# Patient Record
Sex: Male | Born: 1965
Health system: Southern US, Community
[De-identification: ages and names within clinical notes are randomized; demographics above are authoritative.]

## PROBLEM LIST (undated history)

## (undated) DIAGNOSIS — K200 Eosinophilic esophagitis: Secondary | ICD-10-CM

## (undated) DIAGNOSIS — Z86010 Personal history of colonic polyps: Secondary | ICD-10-CM

## (undated) DIAGNOSIS — T7840XA Allergy, unspecified, initial encounter: Secondary | ICD-10-CM

## (undated) DIAGNOSIS — K219 Gastro-esophageal reflux disease without esophagitis: Secondary | ICD-10-CM

## (undated) DIAGNOSIS — K648 Other hemorrhoids: Secondary | ICD-10-CM

## (undated) DIAGNOSIS — K222 Esophageal obstruction: Secondary | ICD-10-CM

## (undated) HISTORY — DX: Allergy, unspecified, initial encounter: T78.40XA

## (undated) HISTORY — PX: HEMORRHOID BANDING: SHX5850

## (undated) HISTORY — PX: COLONOSCOPY W/ BIOPSIES: SHX1374

## (undated) HISTORY — DX: Eosinophilic esophagitis: K20.0

## (undated) HISTORY — PX: COLONOSCOPY: SHX174

## (undated) HISTORY — DX: Personal history of colonic polyps: Z86.010

## (undated) HISTORY — DX: Esophageal obstruction: K22.2

## (undated) HISTORY — PX: UPPER GASTROINTESTINAL ENDOSCOPY: SHX188

## (undated) HISTORY — PX: VASECTOMY: SHX75

## (undated) HISTORY — DX: Gastro-esophageal reflux disease without esophagitis: K21.9

## (undated) HISTORY — PX: POLYPECTOMY: SHX149

## (undated) HISTORY — PX: WISDOM TOOTH EXTRACTION: SHX21

## (undated) HISTORY — DX: Other hemorrhoids: K64.8

---

## 1999-12-24 ENCOUNTER — Emergency Department (HOSPITAL_COMMUNITY): Admission: EM | Admit: 1999-12-24 | Discharge: 1999-12-24 | Payer: Self-pay | Admitting: Emergency Medicine

## 2003-07-31 DIAGNOSIS — K222 Esophageal obstruction: Secondary | ICD-10-CM

## 2003-07-31 HISTORY — DX: Esophageal obstruction: K22.2

## 2004-01-12 ENCOUNTER — Ambulatory Visit (HOSPITAL_COMMUNITY): Admission: RE | Admit: 2004-01-12 | Discharge: 2004-01-12 | Payer: Self-pay | Admitting: Gastroenterology

## 2004-01-12 HISTORY — PX: ESOPHAGOGASTRODUODENOSCOPY: SHX1529

## 2008-01-14 ENCOUNTER — Emergency Department (HOSPITAL_COMMUNITY): Admission: EM | Admit: 2008-01-14 | Discharge: 2008-01-14 | Payer: Self-pay | Admitting: Emergency Medicine

## 2010-01-15 IMAGING — CR DG FOOT 2V*R*
2 series · 2 of 2 positions shown · non-contrast
Comparison: None available.

CLINICAL DATA: Status post ankle dislocation.

RIGHT FOOT - 2 VIEW

[t foot ap right * (1 of 2)]
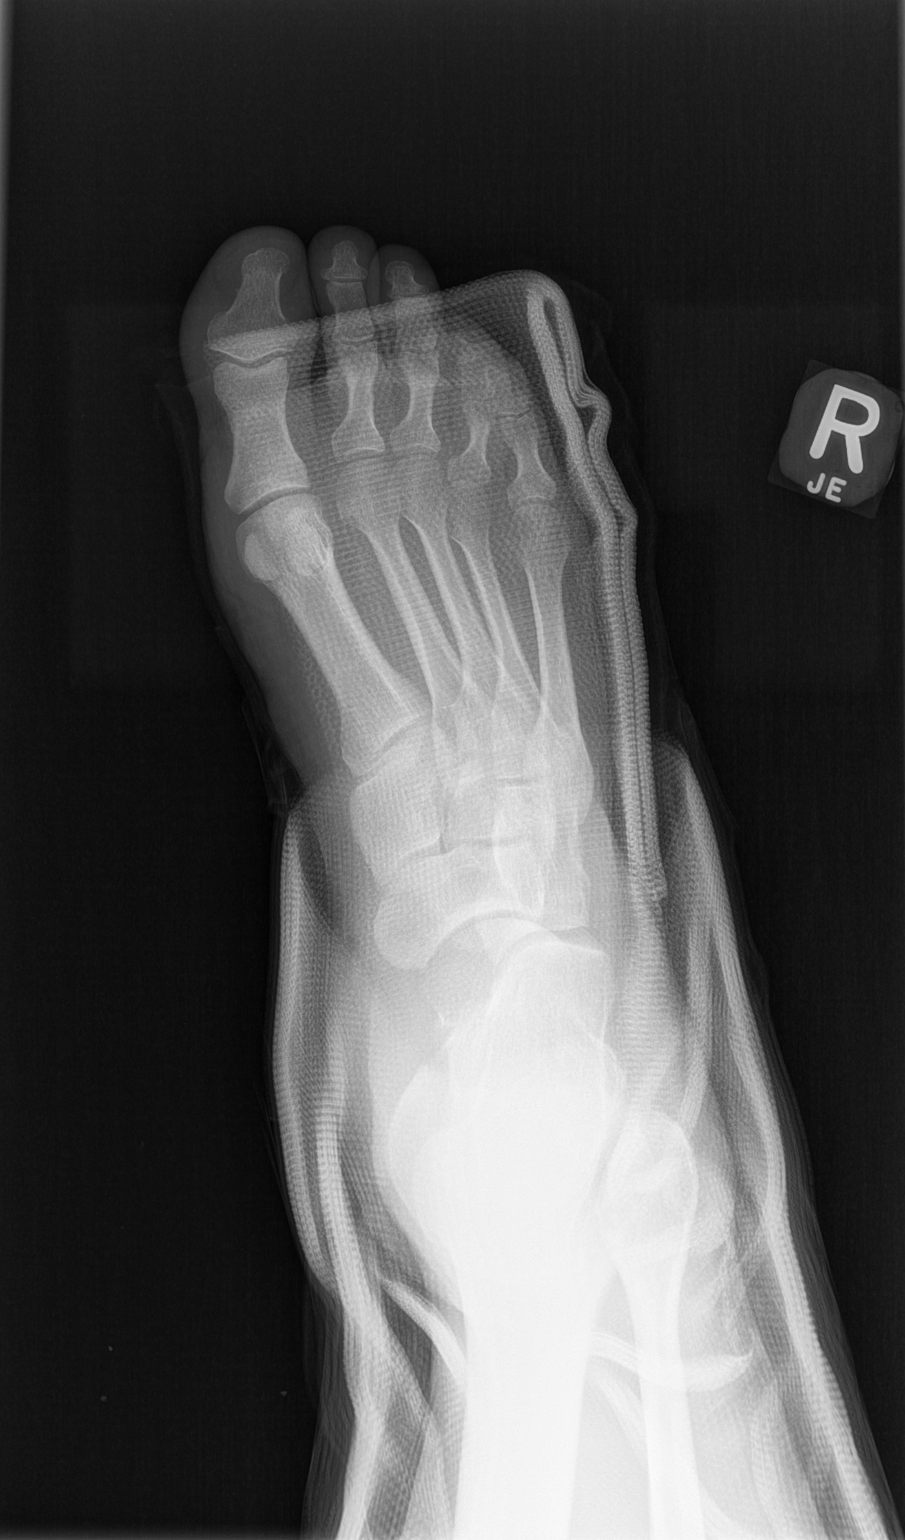

[t foot ap right * (2 of 2)]
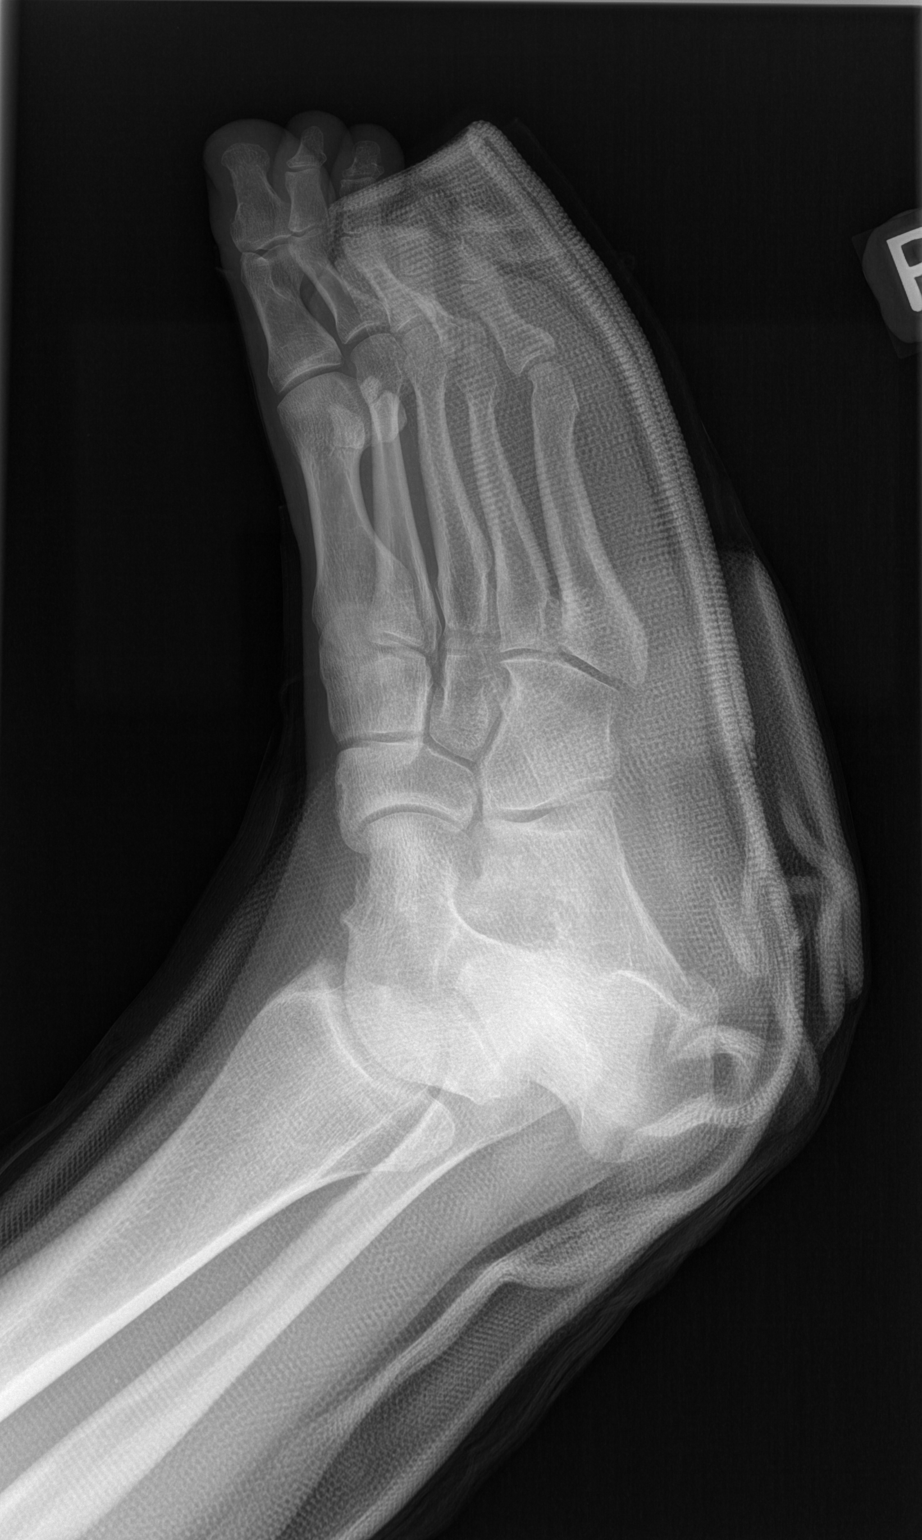

[2 of 2 positions shown; findings below may reference images not displayed]

FINDINGS: Two views the right foot are provided.  The patient is in
a fiberglass cast.  No acute bony or joint abnormality is
identified.
IMPRESSION: No acute finding.

## 2010-12-15 NOTE — Op Note (Signed)
Stephen Chambers, Stephen Chambers                           ACCOUNT NO.:  [id number]   MEDICAL RECORD NO.:  [id number]                   PATIENT TYPE:  AMB   LOCATION:  ENDO                                 FACILITY:  [facility]   PHYSICIAN:  John C. Madilyn Fireman, M.D.                 DATE OF BIRTH:  1965/10/12   DATE OF PROCEDURE:  01/12/2004  DATE OF DISCHARGE:                                 OPERATIVE REPORT   PROCEDURE:  Esophagogastroduodenoscopy with esophageal dilatation.   INDICATIONS FOR PROCEDURE:  Dysphagia is suggestive of lower esophageal ring  or stricture.   DESCRIPTION OF PROCEDURE:  The patient was placed in the left lateral  decubitus position and placed on the pulse monitor, with continuous low-flow  oxygen delivered by nasal cannula.  He was sedated with 62.5 mcg IV fentanyl  and 5 mg IV Versed.  The Olympus video endoscope was advanced under direct  vision into the oropharynx and esophagus.  The esophagus was straight and of  normal caliber, with the squamocolumnar line at 38 cm.  There did appear to  be some mild stricturing or indistinct ring at 38 cm.  There was no visible  hiatal hernia.   The stomach was entered and a small amount of liquid secretions were  suctioned from the fundus.  Retroflexed view of the cardia was unremarkable.  The fundus, body, antrum and pylorus all appeared normal.  The duodenum was  entered and both the bulb and second portion were well inspected and  appeared to be within normal limits.  The scope was advanced as far as  possible down the distal duodenum and a Savary guide wire placed, with the  scope withdrawn.  A single 17 mm Savary dilator was passed over the guide  wire with mild to moderate resistance and no blood seen on withdrawal.  The  dilator was removed together with the wire.   The patient was returned to the recovery room in stable condition.  He  tolerated the procedure well and there were no immediate complications.   IMPRESSION:  Subtle  lower esophageal ring; status post dilatation to 17 mm.   PLAN:  Advance diet and observe for response to dilatation.                                               John C. Madilyn Fireman, M.D.    JCH/MEDQ  D:  01/12/2004  T:  01/12/2004  Job:  [id number]   cc:   Havery Moros  [address]  [place]  [place] [address]  Fax: [phone]

## 2012-06-24 ENCOUNTER — Ambulatory Visit (INDEPENDENT_AMBULATORY_CARE_PROVIDER_SITE_OTHER): Payer: BC Managed Care – PPO | Admitting: Family Medicine

## 2012-06-24 DIAGNOSIS — Z23 Encounter for immunization: Secondary | ICD-10-CM

## 2012-12-05 ENCOUNTER — Encounter: Payer: Self-pay | Admitting: Internal Medicine

## 2012-12-25 ENCOUNTER — Encounter: Payer: Self-pay | Admitting: Internal Medicine

## 2012-12-30 ENCOUNTER — Ambulatory Visit (INDEPENDENT_AMBULATORY_CARE_PROVIDER_SITE_OTHER): Payer: BC Managed Care – PPO | Admitting: Internal Medicine

## 2012-12-30 ENCOUNTER — Encounter: Payer: Self-pay | Admitting: Internal Medicine

## 2012-12-30 VITALS — BP 102/70 | HR 72 | Ht 70.0 in | Wt 170.2 lb

## 2012-12-30 DIAGNOSIS — R1310 Dysphagia, unspecified: Secondary | ICD-10-CM

## 2012-12-30 DIAGNOSIS — K648 Other hemorrhoids: Secondary | ICD-10-CM

## 2012-12-30 MED ORDER — HYDROCORTISONE ACETATE 25 MG RE SUPP: 25.0000 mg | Freq: Every day | RECTAL | Status: DC

## 2012-12-30 NOTE — Progress Notes (Signed)
Subjective:    Patient ID: Stephen Chambers, male    DOB: 1965/08/04, 47 y.o.   MRN: [id number]  HPI The patient is a pleasant married 47 year old white man who presents with complaints of dysphagia. He's had intermittent problems in the past and he underwent an EGD with dilation of a subtle ring in 2005 by Dr. Madilyn Fireman. He recalls improving but has not taken her regular PPI and over the years he has developed intermittent solid food dysphagia with a suprasternal sticking point. Food such as steak, chicken, rice and bread will cause problems. There are spell where he'll have frequent episodes during a week and then he'll go for weeks without. There has been no weight loss or bleeding. He does not really have significant heartburn symptoms.  He also has complaints of itching in the anal area and what she perceives to be hemorrhoids. He does not have a daily bowel movement, he does sometimes sit more than a few minutes on the toilet but he denies significant straining and there is no rectal pain. He uses light weights in the gym. There are no other clear risk factors for hemorrhoids. He does have some mucus discharge in the anal area on the underwear at times and very rarely he has seen a slight streak of red blood on the toilet paper only. His bowel habits are regular overall in that they are unchanged and he does have several bowel movements a week.  His GI review of systems is otherwise negative.  No Known Allergies No outpatient prescriptions prior to visit.   No facility-administered medications prior to visit.   Past Medical History  Diagnosis Date  . Schatzki's ring 2005   Past Surgical History  Procedure Laterality Date  . Esophagogastroduodenoscopy  01/12/04   History   Social History  . Marital Status: Married    Spouse Name: Dionne    Number of Children: 3       Occupational History  . construction-commercial     owner   Social History Main Topics  . Smoking status: Never Smoker    . Smokeless tobacco: Never Used  . Alcohol Use: Yes  . Drug Use: No       Other Topics Concern  . None   Social History Narrative   Married, 2 sons one daughter.   He owns a Control and instrumentation engineer.   2-3 caffeinated beverages a day and 2 alcoholic beverages a day as of 12/30/2012.   Family History  Problem Relation Age of Onset  . Colon cancer Paternal Grandfather        Review of Systems This is positive for allergies and sinus trouble but all other review of systems are negative or except as per history of present illness.    Objective:   Physical Exam General:  Well-developed, well-nourished and in no acute distress Eyes:  anicteric. Neck:   supple w/o thyromegaly or mass.  Lungs: Clear to auscultation bilaterally. Heart:  S1S2, no rubs, murmurs, gallops. Abdomen:  soft, non-tender, no hepatosplenomegaly, hernia, or mass and BS+.  Rectal:  normal anoderm and no mass, normal resting tone. GU:  Normal prostate Lymph:  no cervical or supraclavicular adenopathy. Extremities:   no edema Skin   no rash. Neuro:  A&O x 3.  Psych:  appropriate mood and  Affect.   Data Reviewed:        Assessment & Plan:  Dysphagia, unspecified(787.20)  Internal hemorrhoids with other complication - Plan: hydrocortisone (ANUSOL-HC) 25 MG suppository  1.  EGD w/ dilation - The risks and benefits as well as alternatives of endoscopic procedure(s) have been discussed and reviewed. All questions answered. The patient agrees to proceed..  2. Anusol HC, Balneol, Benefiber, Hemorrhoid handout. Consider hemorrhoid ligation in future.

## 2012-12-30 NOTE — Patient Instructions (Addendum)
You have been scheduled for an endoscopy with propofol. Please follow written instructions given to you at your visit today. If you use inhalers (even only as needed), please bring them with you on the day of your procedure. Your physician has requested that you go to www.startemmi.com and enter the access code given to you at your visit today. This web site gives a general overview about your procedure. However, you should still follow specific instructions given to you by our office regarding your preparation for the procedure.   Purchase BALENOL over the counter to use on your hemorrhoids.  [facility] has this.  Also purchase a Fiber supplement, we are giving you information on the different brands.  We have sent the following medications to your pharmacy for you to pick up at your convenience: [facility]   Today we are giving you information to read on hemorrhoid non-surgical treatment.  Go to www.[email or web address] for more information.  Thank you for choosing me and [facility].  Iva Boop, M.D., [facility]

## 2012-12-31 ENCOUNTER — Encounter: Payer: Self-pay | Admitting: Internal Medicine

## 2013-01-21 ENCOUNTER — Ambulatory Visit (AMBULATORY_SURGERY_CENTER): Payer: BC Managed Care – PPO | Admitting: Internal Medicine

## 2013-01-21 ENCOUNTER — Encounter: Payer: Self-pay | Admitting: Internal Medicine

## 2013-01-21 VITALS — BP 111/67 | HR 60 | Temp 98.0°F | Resp 13 | Ht 70.0 in | Wt 170.0 lb

## 2013-01-21 DIAGNOSIS — K200 Eosinophilic esophagitis: Secondary | ICD-10-CM

## 2013-01-21 DIAGNOSIS — R1310 Dysphagia, unspecified: Secondary | ICD-10-CM

## 2013-01-21 DIAGNOSIS — R933 Abnormal findings on diagnostic imaging of other parts of digestive tract: Secondary | ICD-10-CM

## 2013-01-21 MED ORDER — PANTOPRAZOLE SODIUM 40 MG PO TBEC: 40.0000 mg | DELAYED_RELEASE_TABLET | Freq: Every day | ORAL | Status: DC

## 2013-01-21 MED ORDER — SODIUM CHLORIDE 0.9 % IV SOLN: 500.0000 mL | INTRAVENOUS | Status: DC

## 2013-01-21 NOTE — Progress Notes (Signed)
Called to room to assist during endoscopic procedure.  Patient ID and intended procedure confirmed with present staff. Received instructions for my participation in the procedure from the performing physician.  

## 2013-01-21 NOTE — Patient Instructions (Addendum)
YOU HAD AN ENDOSCOPIC PROCEDURE TODAY AT THE [facility]: Refer to the procedure report that was given to you for any specific questions about what was found during the examination.  If the procedure report does not answer your questions, please call your gastroenterologist to clarify.  If you requested that your care partner not be given the details of your procedure findings, then the procedure report has been included in a sealed envelope for you to review at your convenience later.  YOU SHOULD EXPECT: Some feelings of bloating in the abdomen. Passage of more gas than usual.  Walking can help get rid of the air that was put into your GI tract during the procedure and reduce the bloating. If you had a lower endoscopy (such as a colonoscopy or flexible sigmoidoscopy) you may notice spotting of blood in your stool or on the toilet paper. If you underwent a bowel prep for your procedure, then you may not have a normal bowel movement for a few days.  DIET: Your first meal following the procedure should be a light meal and then it is ok to progress to your normal diet.  A half-sandwich or bowl of soup is an example of a good first meal.  Heavy or fried foods are harder to digest and may make you feel nauseous or bloated.  Likewise meals heavy in dairy and vegetables can cause extra gas to form and this can also increase the bloating.  Drink plenty of fluids but you should avoid alcoholic beverages for 24 hours.  ACTIVITY: Your care partner should take you home directly after the procedure.  You should plan to take it easy, moving slowly for the rest of the day.  You can resume normal activity the day after the procedure however you should NOT DRIVE or use heavy machinery for 24 hours (because of the sedation medicines used during the test).    SYMPTOMS TO REPORT IMMEDIATELY: A gastroenterologist can be reached at any hour.  During normal business hours, 8:30 AM to 5:00 PM Monday through Friday,  call [phone].  After hours and on weekends, please call the GI answering service at [phone] who will take a message and have the physician on call contact you.   Following upper endoscopy (EGD)  Vomiting of blood or coffee ground material  New chest pain or pain under the shoulder blades  Painful or persistently difficult swallowing  New shortness of breath  Fever of 100F or higher  Black, tarry-looking stools  FOLLOW UP: If any biopsies were taken you will be contacted by phone or by letter within the next 1-3 weeks.  Call your gastroenterologist if you have not heard about the biopsies in 3 weeks.  Our staff will call the home number listed on your records the next business day following your procedure to check on you and address any questions or concerns that you may have at that time regarding the information given to you following your procedure. This is a courtesy call and so if there is no answer at the home number and we have not heard from you through the emergency physician on call, we will assume that you have returned to your regular daily activities without incident.  SIGNATURES/CONFIDENTIALITY: You and/or your care partner have signed paperwork which will be entered into your electronic medical record.  These signatures attest to the fact that that the information above on your After Visit Summary has been reviewed and is understood.  Full responsibility   of the confidentiality of this discharge information lies with you and/or your care-partner.  The esophagus had changes that look like a condition called eosinophilic esophagitis. It is a condition related to reflux and allergy though it is difficult to sort out the allergies in most cases.  I took biopsies and dilated the esophagus to 16 mm. You will avoid regular foods today.  I am going to start you on an acid-blocking medicine as that usually helps this problem. Sometimes other allergy medications are  needed.  Please schedule a follow-up visit for August.  I appreciate the opportunity to care for you. Iva Boop, MD, [facility]

## 2013-01-21 NOTE — Op Note (Addendum)
[facility] [address] [place], [address]   ENDOSCOPY PROCEDURE REPORT  PATIENT: Julyan, Gales  MR#: [id number] BIRTHDATE: 06/17/1966 , 47  yrs. old GENDER: Male ENDOSCOPIST: Iva Boop, MD, [facility] PROCEDURE DATE:  01/21/2013 PROCEDURE:  EGD w/ biopsy and Maloney dilation of esophagus ASA CLASS:     Class I INDICATIONS:  Dysphagia. MEDICATIONS: propofol (Diprivan) 300mg  IV, MAC sedation, administered by CRNA, and These medications were titrated to patient response per physician's verbal order TOPICAL ANESTHETIC: none  DESCRIPTION OF PROCEDURE: After the risks benefits and alternatives of the procedure were thoroughly explained, informed consent was obtained.  The LB [id number] endoscope was introduced through the mouth and advanced to the second portion of the duodenum. Without limitations.  The instrument was slowly withdrawn as the mucosa was fully examined.       ESOPHAGUS: Eosinophilic esophagitis was suspected with mucosal changes that included circumferential folds, longitudinal furrows and white spots were found in the entire esophagus.   Biopsies of the proximal, mid and distal esophagus taken.  The remainder of the upper endoscopy exam was otherwise normal. Retroflexed views revealed no abnormalities.     The scope was then withdrawn from the patient, a 48 Fr. Maloney dilator passed, scope re-inserted and no major tears seen. The procedure was completed.  COMPLICATIONS: There were no complications. ENDOSCOPIC IMPRESSION: 1.   Eosinophilic esophagitis was suspected in the entire esophagus, biopsied and dilated 48 Fr 2.   The remainder of the upper endoscopy exam was otherwise normal  RECOMMENDATIONS: 1.  Clear liquids until , then soft foods rest iof day.  Resume prior diet tomorrow. 2.  PPI qam - pantoprazole 40 mg every AM 3.   call for appointment with Dr.  Leone Payor - to be seen in August  eSigned:  Iva Boop, MD, [facility]  01/21/2013 2:52 PM  CC:The Patient

## 2013-01-22 ENCOUNTER — Telehealth: Payer: Self-pay | Admitting: *Deleted

## 2013-01-22 NOTE — Telephone Encounter (Signed)
No answer, message left for the patient. 

## 2013-01-27 ENCOUNTER — Encounter: Payer: Self-pay | Admitting: Internal Medicine

## 2013-01-27 DIAGNOSIS — K200 Eosinophilic esophagitis: Secondary | ICD-10-CM

## 2013-01-27 HISTORY — DX: Eosinophilic esophagitis: K20.0

## 2013-01-27 NOTE — Progress Notes (Signed)
Quick Note:  Eosinophilic esophagitis Stay on PPI See me in August ______

## 2013-06-12 ENCOUNTER — Ambulatory Visit (INDEPENDENT_AMBULATORY_CARE_PROVIDER_SITE_OTHER): Payer: BC Managed Care – PPO

## 2013-06-12 DIAGNOSIS — Z23 Encounter for immunization: Secondary | ICD-10-CM

## 2014-02-14 ENCOUNTER — Other Ambulatory Visit: Payer: Self-pay | Admitting: Internal Medicine

## 2014-04-17 ENCOUNTER — Other Ambulatory Visit: Payer: Self-pay | Admitting: Internal Medicine

## 2014-05-21 ENCOUNTER — Other Ambulatory Visit: Payer: Self-pay | Admitting: Internal Medicine

## 2014-08-09 ENCOUNTER — Ambulatory Visit (INDEPENDENT_AMBULATORY_CARE_PROVIDER_SITE_OTHER): Payer: BLUE CROSS/BLUE SHIELD | Admitting: Internal Medicine

## 2014-08-09 ENCOUNTER — Encounter: Payer: Self-pay | Admitting: Internal Medicine

## 2014-08-09 VITALS — BP 102/76 | HR 72 | Ht 69.0 in | Wt 170.2 lb

## 2014-08-09 DIAGNOSIS — K200 Eosinophilic esophagitis: Secondary | ICD-10-CM

## 2014-08-09 DIAGNOSIS — K648 Other hemorrhoids: Secondary | ICD-10-CM

## 2014-08-09 HISTORY — DX: Other hemorrhoids: K64.8

## 2014-08-09 MED ORDER — PANTOPRAZOLE SODIUM 40 MG PO TBEC: DELAYED_RELEASE_TABLET | ORAL | Status: DC

## 2014-08-09 NOTE — Assessment & Plan Note (Signed)
Grade 2 on anoscopy RP banded today to sstart Tx to see if swelling and anal irritation are relieved w/ Tx. RTC 2 weeks to band others if needed

## 2014-08-09 NOTE — Patient Instructions (Signed)
HEMORRHOID BANDING PROCEDURE    FOLLOW-UP CARE   1. The procedure you have had should have been relatively painless since the banding of the area involved does not have nerve endings and there is no pain sensation.  The rubber band cuts off the blood supply to the hemorrhoid and the band may fall off as soon as 48 hours after the banding (the band may occasionally be seen in the toilet bowl following a bowel movement). You may notice a temporary feeling of fullness in the rectum which should respond adequately to plain Tylenol or Motrin.  2. Following the banding, avoid strenuous exercise that evening and resume full activity the next day.  A sitz bath (soaking in a warm tub) or bidet is soothing, and can be useful for cleansing the area after bowel movements.     3. To avoid constipation, take two tablespoons of natural wheat bran, natural oat bran, flax, Benefiber or any over the counter fiber supplement and increase your water intake to 7-8 glasses daily.    4. Unless you have been prescribed anorectal medication, do not put anything inside your rectum for two weeks: No suppositories, enemas, fingers, etc.  5. Occasionally, you may have more bleeding than usual after the banding procedure.  This is often from the untreated hemorrhoids rather than the treated one.  Don't be concerned if there is a tablespoon or so of blood.  If there is more blood than this, lie flat with your bottom higher than your head and apply an ice pack to the area. If the bleeding does not stop within a half an hour or if you feel faint, call our office at [phone] or go to the emergency room.  6. Problems are not common; however, if there is a substantial amount of bleeding, severe pain, chills, fever or difficulty passing urine (very rare) or other problems, you should call us at [phone] or report to the nearest emergency room.  7. Do not stay seated continuously for more than 2-3 hours for a day or two  after the procedure.  Tighten your buttock muscles 10-15 times every two hours and take 10-15 deep breaths every 1-2 hours.  Do not spend more than a few minutes on the toilet if you cannot empty your bowel; instead re-visit the toilet at a later time.    We have sent the following medications to your pharmacy for you to pick up at your convenience: Protonix  We have made your next banding appointment for January 25th at 3:45pm.   I appreciate the opportunity to care for you. Silvano Rusk, M.D., [facility]

## 2014-08-09 NOTE — Progress Notes (Signed)
   Subjective:    Patient ID: Stephen Chambers, male    DOB: February 29, 1966, 49 y.o.   MRN: [id number]  HPI Here for f/u eosiniophilic esophagitis. Has ? About anal sxs and need for colonoscopy.  1x month dysphagia - "can feel food move slowly" - not impact. Needs refill on PPI.  Still has irritation post-defecation with some itching, swelling. Rare BRB with wiping. Tried fiber supplements but did not change q 3 d BM - so stopped. Easy defecation without straining reported. No pain with defecation but has anal irritation sxs after. Also tried [facility] suppositories without much benefit.  Paternal grandfather had colon cancer late 29's. Maternal grandmother had it in 70's. His wife wants him to see if he needs a colonoscopy now.  Review of Systems     Objective:   Physical Exam NAD  Rectal exam with some anals spasm and increased resting tone but NL anoderm and non-tender.    Anoscopy was performed with the patient in the left lateral decubitus position and revealed Grade 2 internal hemorrhoids in all positions.   PROCEDURE NOTE: The patient presents with symptomatic grade 2  hemorrhoids, requesting rubber band ligation of his/her hemorrhoidal disease.  All risks, benefits and alternative forms of therapy were described and informed consent was obtained.   The anorectum was pre-medicated with 0.125% NTG and 5% lidocaine The decision was made to band the RP internal hemorrhoid, and the [place] was used to perform band ligation without complication.  Digital anorectal examination was then performed to assure proper positioning of the band, and to adjust the banded tissue as required.  The patient was discharged home without pain or other issues.  Dietary and behavioral recommendations were given and along with follow-up instructions.      The patient will return in 2 weeks for  follow-up and possible additional banding as required. No complications were encountered and the patient  tolerated the procedure well.       Assessment & Plan:  Eosinophilic esophagitis Refill PPI today  Internal hemorrhoids with complication Grade 2 on anoscopy RP banded today to sstart Tx to see if swelling and anal irritation are relieved w/ Tx. RTC 2 weeks to band others if needed   Colonoscopy at age 12 - he is not at high risk.

## 2014-08-09 NOTE — Assessment & Plan Note (Addendum)
Refill PPI today

## 2014-08-23 ENCOUNTER — Ambulatory Visit (INDEPENDENT_AMBULATORY_CARE_PROVIDER_SITE_OTHER): Payer: BLUE CROSS/BLUE SHIELD | Admitting: Internal Medicine

## 2014-08-23 ENCOUNTER — Encounter: Payer: Self-pay | Admitting: Internal Medicine

## 2014-08-23 VITALS — BP 114/68 | HR 68 | Ht 69.0 in | Wt 174.0 lb

## 2014-08-23 DIAGNOSIS — K648 Other hemorrhoids: Secondary | ICD-10-CM

## 2014-08-23 NOTE — Patient Instructions (Signed)
HEMORRHOID BANDING PROCEDURE    FOLLOW-UP CARE   1. The procedure you have had should have been relatively painless since the banding of the area involved does not have nerve endings and there is no pain sensation.  The rubber band cuts off the blood supply to the hemorrhoid and the band may fall off as soon as 48 hours after the banding (the band may occasionally be seen in the toilet bowl following a bowel movement). You may notice a temporary feeling of fullness in the rectum which should respond adequately to plain Tylenol or Motrin.  2. Following the banding, avoid strenuous exercise that evening and resume full activity the next day.  A sitz bath (soaking in a warm tub) or bidet is soothing, and can be useful for cleansing the area after bowel movements.     3. To avoid constipation, take two tablespoons of natural wheat bran, natural oat bran, flax, Benefiber or any over the counter fiber supplement and increase your water intake to 7-8 glasses daily.    4. Unless you have been prescribed anorectal medication, do not put anything inside your rectum for two weeks: No suppositories, enemas, fingers, etc.  5. Occasionally, you may have more bleeding than usual after the banding procedure.  This is often from the untreated hemorrhoids rather than the treated one.  Don't be concerned if there is a tablespoon or so of blood.  If there is more blood than this, lie flat with your bottom higher than your head and apply an ice pack to the area. If the bleeding does not stop within a half an hour or if you feel faint, call our office at [phone] or go to the emergency room.  6. Problems are not common; however, if there is a substantial amount of bleeding, severe pain, chills, fever or difficulty passing urine (very rare) or other problems, you should call us at [phone] or report to the nearest emergency room.  7. Do not stay seated continuously for more than 2-3 hours for a day or two  after the procedure.  Tighten your buttock muscles 10-15 times every two hours and take 10-15 deep breaths every 1-2 hours.  Do not spend more than a few minutes on the toilet if you cannot empty your bowel; instead re-visit the toilet at a later time.    Please follow up with [place] as needed.   I appreciate the opportunity to care for you. Silvano Rusk, M.D., [facility]

## 2014-08-23 NOTE — Assessment & Plan Note (Signed)
LL and RA banded 

## 2014-08-23 NOTE — Progress Notes (Signed)
Patient ID: Stephen Chambers, male   DOB: 07-Jun-1966, 49 y.o.   MRN: [id number]     PROCEDURE NOTE: The patient presents with symptomatic grade 2  hemorrhoids, requesting rubber band ligation of his/her hemorrhoidal disease.  All risks, benefits and alternative forms of therapy were described and informed consent was obtained.   The anorectum was pre-medicated with 0.125% NTG and 5% lidocaine The decision was made to band the LL and RA  internal hemorrhoids, and the [place] was used to perform band ligation without complication.  Digital anorectal examination was then performed to assure proper positioning of the band, and to adjust the banded tissue as required.  The patient was discharged home without pain or other issues.  Dietary and behavioral recommendations were given and along with follow-up instructions.      The patient will return in prn for  follow-up and possible additional banding as required. No complications were encountered and the patient tolerated the procedure well.

## 2015-05-06 ENCOUNTER — Ambulatory Visit (INDEPENDENT_AMBULATORY_CARE_PROVIDER_SITE_OTHER): Payer: BLUE CROSS/BLUE SHIELD

## 2015-05-06 DIAGNOSIS — Z23 Encounter for immunization: Secondary | ICD-10-CM

## 2015-08-30 ENCOUNTER — Ambulatory Visit (INDEPENDENT_AMBULATORY_CARE_PROVIDER_SITE_OTHER): Payer: BLUE CROSS/BLUE SHIELD | Admitting: Internal Medicine

## 2015-08-30 ENCOUNTER — Encounter: Payer: Self-pay | Admitting: Internal Medicine

## 2015-08-30 VITALS — BP 110/70 | HR 60 | Ht 69.0 in | Wt 168.4 lb

## 2015-08-30 DIAGNOSIS — R194 Change in bowel habit: Secondary | ICD-10-CM | POA: Diagnosis not present

## 2015-08-30 DIAGNOSIS — K625 Hemorrhage of anus and rectum: Secondary | ICD-10-CM | POA: Diagnosis not present

## 2015-08-30 NOTE — Patient Instructions (Signed)
  You have been scheduled for a colonoscopy. Please follow written instructions given to you at your visit today.  Please pick up your over the counter prep supplies at the pharmacy. If you use inhalers (even only as needed), please bring them with you on the day of your procedure. Your physician has requested that you go to www.startemmi.com and enter the access code given to you at your visit today. This web site gives a general overview about your procedure. However, you should still follow specific instructions given to you by our office regarding your preparation for the procedure.   I appreciate the opportunity to care for you. Carl Gessner, MD, FACG  

## 2015-08-30 NOTE — Progress Notes (Signed)
   Subjective:    Patient ID: Stephen Chambers, male    DOB: 1965-11-27, 50 y.o.   MRN: [id number] Chief complaint: Fecal soiling hemorrhoids HPI  The patient is here for follow-up. Approximate a year ago he had banding of hemorrhoids in an effort to help fecal soiling and rare rectal bleeding. He says he is really not any better. Still having difficulty cleansing after defecation and irritation and rare rectal bleeding Not putting stool off so going more frequently - usu q 2-3 d but maybe more now No pain with defecation  Was Rx cipro by PCP recently for the urgent defecation  Medications, allergies, past medical history, past surgical history, family history and social history are reviewed and updated in the EMR.  Review of Systems No GU sxs except occasional urgency    Objective:   Physical Exam BP 110/70 mmHg  Pulse 60  Ht 5\' 9"  (1.753 m)  Wt 168 lb 6.4 oz (76.386 kg)  BMI 24.86 kg/m2 abd NT  Anoderm inspection revealed NL Anal wink was absent Digital exam revealed normal resting tone and voluntary squeeze. No mass or rectocele present. Simulated defecation with valsalva revealed appropriate abdominal contraction and descent.  Anoscopy shows what I think are grade 1 internal hemorrhoids in all 3 positions     Assessment & Plan:   1. Rectal bleeding   2. Altered bowel habits    Could be all attributable to his hemorrhoids however at this point since its persisted I think in his age range a colonoscopy is reasonable. Hopefully it will be negative but I think we need to know that. Venting consider trying to repeat band the hemorrhoids to see if that helps. Anorectal manometry could be necessary. I do think he should take a fiber supplementation on a regular basis.The risks and benefits as well as alternatives of endoscopic procedure(s) have been discussed and reviewed. All questions answered. The patient agrees to proceed. I appreciate the opportunity to care for this  patient. CC: [name], MD

## 2015-09-04 ENCOUNTER — Other Ambulatory Visit: Payer: Self-pay | Admitting: Internal Medicine

## 2015-09-05 ENCOUNTER — Encounter: Payer: BLUE CROSS/BLUE SHIELD | Admitting: Internal Medicine

## 2015-09-05 ENCOUNTER — Telehealth: Payer: Self-pay | Admitting: Internal Medicine

## 2015-09-05 NOTE — Telephone Encounter (Signed)
Patient advised of to start about [phone]

## 2015-09-07 ENCOUNTER — Encounter: Payer: Self-pay | Admitting: Internal Medicine

## 2015-09-07 ENCOUNTER — Ambulatory Visit (AMBULATORY_SURGERY_CENTER): Payer: BLUE CROSS/BLUE SHIELD | Admitting: Internal Medicine

## 2015-09-07 VITALS — BP 106/65 | HR 59 | Temp 98.0°F | Resp 17 | Ht 69.0 in | Wt 168.0 lb

## 2015-09-07 DIAGNOSIS — D122 Benign neoplasm of ascending colon: Secondary | ICD-10-CM

## 2015-09-07 DIAGNOSIS — D125 Benign neoplasm of sigmoid colon: Secondary | ICD-10-CM

## 2015-09-07 DIAGNOSIS — K648 Other hemorrhoids: Secondary | ICD-10-CM

## 2015-09-07 DIAGNOSIS — K635 Polyp of colon: Secondary | ICD-10-CM | POA: Diagnosis not present

## 2015-09-07 DIAGNOSIS — K625 Hemorrhage of anus and rectum: Secondary | ICD-10-CM | POA: Diagnosis not present

## 2015-09-07 MED ORDER — SODIUM CHLORIDE 0.9 % IV SOLN: 500.0000 mL | INTRAVENOUS | Status: DC

## 2015-09-07 NOTE — Progress Notes (Signed)
Called to room to assist during endoscopic procedure.  Patient ID and intended procedure confirmed with present staff. Received instructions for my participation in the procedure from the performing physician.  

## 2015-09-07 NOTE — Patient Instructions (Addendum)
I found and removed 2 tiny polyps and saw hemorrhoids.  I will let you know pathology results and when to have another routine colonoscopy by mail.  We will try banding the hemorrhoids some more.  I appreciate the opportunity to care for you. Gatha Mayer, MD, FACG  YOU HAD AN ENDOSCOPIC PROCEDURE TODAY AT [facility]:   Refer to the procedure report that was given to you for any specific questions about what was found during the examination.  If the procedure report does not answer your questions, please call your gastroenterologist to clarify.  If you requested that your care partner not be given the details of your procedure findings, then the procedure report has been included in a sealed envelope for you to review at your convenience later.  YOU SHOULD EXPECT: Some feelings of bloating in the abdomen. Passage of more gas than usual.  Walking can help get rid of the air that was put into your GI tract during the procedure and reduce the bloating. If you had a lower endoscopy (such as a colonoscopy or flexible sigmoidoscopy) you may notice spotting of blood in your stool or on the toilet paper. If you underwent a bowel prep for your procedure, you may not have a normal bowel movement for a few days.  Please Note:  You might notice some irritation and congestion in your nose or some drainage.  This is from the oxygen used during your procedure.  There is no need for concern and it should clear up in a day or so.  SYMPTOMS TO REPORT IMMEDIATELY:   Following lower endoscopy (colonoscopy or flexible sigmoidoscopy):  Excessive amounts of blood in the stool  Significant tenderness or worsening of abdominal pains  Swelling of the abdomen that is new, acute  Fever of 100F or higher  For urgent or emergent issues, a gastroenterologist can be reached at any hour by calling [phone].   DIET: Your first meal following the procedure should be a small meal and then  it is ok to progress to your normal diet. Heavy or fried foods are harder to digest and may make you feel nauseous or bloated.  Likewise, meals heavy in dairy and vegetables can increase bloating.  Drink plenty of fluids but you should avoid alcoholic beverages for 24 hours.  ACTIVITY:  You should plan to take it easy for the rest of today and you should NOT DRIVE or use heavy machinery until tomorrow (because of the sedation medicines used during the test).    FOLLOW UP: Our staff will call the number listed on your records the next business day following your procedure to check on you and address any questions or concerns that you may have regarding the information given to you following your procedure. If we do not reach you, we will leave a message.  However, if you are feeling well and you are not experiencing any problems, there is no need to return our call.  We will assume that you have returned to your regular daily activities without incident.  If any biopsies were taken you will be contacted by phone or by letter within the next 1-3 weeks.  Please call us at [phone] if you have not heard about the biopsies in 3 weeks.    SIGNATURES/CONFIDENTIALITY: You and/or your care partner have signed paperwork which will be entered into your electronic medical record.  These signatures attest to the fact that that the information above on  your After Visit Summary has been reviewed and is understood.  Full responsibility of the confidentiality of this discharge information lies with you and/or your care-partner.  Please review polyp and hemorrhoid handouts provided.

## 2015-09-07 NOTE — Progress Notes (Signed)
Patient awakening,vss,report to rn 

## 2015-09-07 NOTE — Op Note (Signed)
[facility]  [address] [place], [address]   COLONOSCOPY PROCEDURE REPORT  PATIENT: Stephen Chambers, Stephen Chambers  MR#: [id number] BIRTHDATE: 03-05-1966 , 50  yrs. old GENDER: male ENDOSCOPIST: Gatha Mayer, MD, [facility] PROCEDURE DATE:  09/07/2015 PROCEDURE:   Colonoscopy, diagnostic and Colonoscopy with snare polypectomy First Screening Colonoscopy - Avg.  risk and is 50 yrs.  old or older - No.  Prior Negative Screening - Now for repeat screening. N/A  History of Adenoma - Now for follow-up colonoscopy & has been > or = to 3 yrs.  N/A  Polyps removed today? Yes ASA CLASS:   Class II INDICATIONS:Evaluation of unexplained GI bleeding and Patient is not applicable for Colorectal Neoplasm Risk Assessment for this procedure. MEDICATIONS: Propofol 350 mg IV and Monitored anesthesia care  DESCRIPTION OF PROCEDURE:   After the risks benefits and alternatives of the procedure were thoroughly explained, informed consent was obtained.  The digital rectal exam revealed no abnormalities of the rectum, revealed the prostate was not enlarged, and revealed no prostatic nodules.   The LB [id number]  endoscope was introduced through the anus and advanced to the cecum, which was identified by both the appendix and ileocecal valve. No adverse events experienced.   The quality of the prep was excellent.  (MiraLax was used)  The instrument was then slowly withdrawn as the colon was fully examined. Estimated blood loss is zero unless otherwise noted in this procedure report.  COLON FINDINGS: Two sessile polyps measuring 5 mm in size were found in the ascending colon and sigmoid colon.  Polypectomies were performed with a cold snare.  The resection was complete, the polyp tissue was completely retrieved and sent to histology.   Internal hemorrhoids were found.   The examination was otherwise normal. Retroflexed views revealed internal hemorrhoids. The time to cecum = 3.0 Withdrawal time =  12.4   The scope was withdrawn and the procedure completed. COMPLICATIONS: There were no immediate complications.  ENDOSCOPIC IMPRESSION: 1.   Two sessile polyps were found in the ascending colon and sigmoid colon; polypectomies were performed with a cold snare 2.   Internal hemorrhoids seen and scars from prior banding 3.   The examination was otherwise normal - excellent prep  RECOMMENDATIONS: 1.  Timing of repeat colonoscopy will be determined by pathology findings. 2.  Will contact him and arrange repeat hemorrhoid banding  eSigned:  Gatha Mayer, MD, [facility] 09/07/2015 8:01 AM   cc: The Patient

## 2015-09-08 ENCOUNTER — Telehealth: Payer: Self-pay | Admitting: *Deleted

## 2015-09-08 NOTE — Telephone Encounter (Signed)
  Follow up Call-  Call back number 09/07/2015 01/21/2013  Post procedure Call Back phone  # [phone]  Permission to leave phone message Yes Yes     Patient questions:  Do you have a fever, pain , or abdominal swelling? No. Pain Score  0 *  Have you tolerated food without any problems? Yes.    Have you been able to return to your normal activities? Yes.    Do you have any questions about your discharge instructions: Diet   No. Medications  No. Follow up visit  No.  Do you have questions or concerns about your Care? No.  Actions: * If pain score is 4 or above: No action needed, pain <4.

## 2015-09-14 ENCOUNTER — Encounter: Payer: Self-pay | Admitting: Internal Medicine

## 2015-09-14 DIAGNOSIS — Z860100 Personal history of colon polyps, unspecified: Secondary | ICD-10-CM | POA: Insufficient documentation

## 2015-09-14 HISTORY — DX: Personal history of colonic polyps: Z86.010

## 2015-09-14 HISTORY — DX: Personal history of colon polyps, unspecified: Z86.0100

## 2015-09-14 NOTE — Progress Notes (Signed)
Quick Note:  Ascending hyperplastic and sigmoid hyperplastic Ascending looked like ssp - recall colon 5 yrs 2022 ______

## 2015-10-11 ENCOUNTER — Encounter: Payer: Self-pay | Admitting: Internal Medicine

## 2015-10-11 ENCOUNTER — Ambulatory Visit (INDEPENDENT_AMBULATORY_CARE_PROVIDER_SITE_OTHER): Payer: BLUE CROSS/BLUE SHIELD | Admitting: Internal Medicine

## 2015-10-11 VITALS — BP 98/68 | HR 64 | Ht 69.0 in | Wt 166.4 lb

## 2015-10-11 DIAGNOSIS — K648 Other hemorrhoids: Secondary | ICD-10-CM

## 2015-10-11 NOTE — Progress Notes (Signed)
  Sxs: - fecal soiling, itching - prior banding last   The anorectum was pre-medicated with NTG and lidocaine.  Anoscopy was performed with the patient in the left lateral decubitus position while a chaperone was present and revealed Gr 2 RP and LL internal hemorrhoisd, Gr 1 RA.  PROCEDURE NOTE: The patient presents with symptomatic grade 2  hemorrhoids, requesting rubber band ligation of his/her hemorrhoidal disease.  All risks, benefits and alternative forms of therapy were described and informed consent was obtained.    The decision was made to band the RP and LL internal hemorrhoids, and the Coward was used to perform band ligation without complication.  Digital anorectal examination was then performed to assure proper positioning of the band, and to adjust the banded tissue as required.  The patient was discharged home without pain or other issues.  Dietary and behavioral recommendations were given and along with follow-up instructions.      The patient will call back if sxs not resolved.  No complications were encountered and the patient tolerated the procedure well.

## 2015-10-11 NOTE — Patient Instructions (Signed)
HEMORRHOID BANDING PROCEDURE    FOLLOW-UP CARE   1. The procedure you have had should have been relatively painless since the banding of the area involved does not have nerve endings and there is no pain sensation.  The rubber band cuts off the blood supply to the hemorrhoid and the band may fall off as soon as 48 hours after the banding (the band may occasionally be seen in the toilet bowl following a bowel movement). You may notice a temporary feeling of fullness in the rectum which should respond adequately to plain Tylenol or Motrin.  2. Following the banding, avoid strenuous exercise that evening and resume full activity the next day.  A sitz bath (soaking in a warm tub) or bidet is soothing, and can be useful for cleansing the area after bowel movements.     3. To avoid constipation, take two tablespoons of natural wheat bran, natural oat bran, flax, Benefiber or any over the counter fiber supplement and increase your water intake to 7-8 glasses daily.    4. Unless you have been prescribed anorectal medication, do not put anything inside your rectum for two weeks: No suppositories, enemas, fingers, etc.  5. Occasionally, you may have more bleeding than usual after the banding procedure.  This is often from the untreated hemorrhoids rather than the treated one.  Don't be concerned if there is a tablespoon or so of blood.  If there is more blood than this, lie flat with your bottom higher than your head and apply an ice pack to the area. If the bleeding does not stop within a half an hour or if you feel faint, call our office at [phone] or go to the emergency room.  6. Problems are not common; however, if there is a substantial amount of bleeding, severe pain, chills, fever or difficulty passing urine (very rare) or other problems, you should call us at [phone] or report to the nearest emergency room.  7. Do not stay seated continuously for more than 2-3 hours for a day or two  after the procedure.  Tighten your buttock muscles 10-15 times every two hours and take 10-15 deep breaths every 1-2 hours.  Do not spend more than a few minutes on the toilet if you cannot empty your bowel; instead re-visit the toilet at a later time.   Follow up in 6-8 weeks if no improvement.   Thank you for choosing me and [facility].  Gatha Mayer, M.D., [facility]

## 2015-10-11 NOTE — Assessment & Plan Note (Signed)
RP and LL banded both second time See if this resolves sxs - if not reassess

## 2015-12-07 ENCOUNTER — Other Ambulatory Visit: Payer: Self-pay | Admitting: Internal Medicine

## 2016-03-11 ENCOUNTER — Other Ambulatory Visit: Payer: Self-pay | Admitting: Internal Medicine

## 2016-04-25 DIAGNOSIS — J00 Acute nasopharyngitis [common cold]: Secondary | ICD-10-CM | POA: Diagnosis not present

## 2016-04-25 DIAGNOSIS — Z23 Encounter for immunization: Secondary | ICD-10-CM | POA: Diagnosis not present

## 2016-07-14 ENCOUNTER — Other Ambulatory Visit: Payer: Self-pay | Admitting: Internal Medicine

## 2016-08-29 DIAGNOSIS — R3915 Urgency of urination: Secondary | ICD-10-CM | POA: Diagnosis not present

## 2016-08-29 DIAGNOSIS — R3912 Poor urinary stream: Secondary | ICD-10-CM | POA: Diagnosis not present

## 2016-10-13 ENCOUNTER — Other Ambulatory Visit: Payer: Self-pay | Admitting: Internal Medicine

## 2017-01-16 ENCOUNTER — Other Ambulatory Visit: Payer: Self-pay | Admitting: Internal Medicine

## 2017-01-16 DIAGNOSIS — Z0000 Encounter for general adult medical examination without abnormal findings: Secondary | ICD-10-CM | POA: Diagnosis not present

## 2017-01-16 DIAGNOSIS — Z125 Encounter for screening for malignant neoplasm of prostate: Secondary | ICD-10-CM | POA: Diagnosis not present

## 2017-01-16 DIAGNOSIS — R3912 Poor urinary stream: Secondary | ICD-10-CM | POA: Diagnosis not present

## 2017-04-19 ENCOUNTER — Other Ambulatory Visit: Payer: Self-pay | Admitting: Internal Medicine

## 2017-05-22 ENCOUNTER — Other Ambulatory Visit: Payer: Self-pay | Admitting: Internal Medicine

## 2017-05-22 DIAGNOSIS — Z23 Encounter for immunization: Secondary | ICD-10-CM | POA: Diagnosis not present

## 2017-05-28 DIAGNOSIS — S92512A Displaced fracture of proximal phalanx of left lesser toe(s), initial encounter for closed fracture: Secondary | ICD-10-CM | POA: Diagnosis not present

## 2017-05-28 DIAGNOSIS — M79672 Pain in left foot: Secondary | ICD-10-CM | POA: Diagnosis not present

## 2017-08-17 ENCOUNTER — Other Ambulatory Visit: Payer: Self-pay | Admitting: Internal Medicine

## 2017-10-11 ENCOUNTER — Encounter: Payer: Self-pay | Admitting: Physician Assistant

## 2017-10-11 ENCOUNTER — Other Ambulatory Visit: Payer: Self-pay

## 2017-10-11 ENCOUNTER — Ambulatory Visit: Payer: BLUE CROSS/BLUE SHIELD | Admitting: Physician Assistant

## 2017-10-11 VITALS — BP 104/60 | HR 85 | Temp 98.5°F | Resp 18 | Ht 69.0 in | Wt 170.2 lb

## 2017-10-11 DIAGNOSIS — R41840 Attention and concentration deficit: Secondary | ICD-10-CM

## 2017-10-11 MED ORDER — METHYLPHENIDATE HCL ER (CD) 10 MG PO CPCR: 10.0000 mg | ORAL_CAPSULE | ORAL | 0 refills | Status: DC

## 2017-10-11 NOTE — Progress Notes (Signed)
Stephen Chambers  MRN: [id number] DOB: 07/01/1966  PCP: Stephen Pepper, MD  Chief Complaint  Patient presents with  . Anxiety    adhd was referred by Stephen Chambers     Subjective:  Pt presents to clinic for discussion to start medication for newly diagnosed ADD, inattentive type.  He started with counseling due to his child being sent home from college.  He did not feel like he was dealing well with it and wanted help.  During his counseling sessions therapist diagnosed his ADD.  He feels like most of his problems are related to reading problems with focus, comprehension.  He also has a hard time with describing his feelings in relationships. His work is deadline focused - so if he has a deadline he is good but self initiation and self start is harder.   Seeing Stephen Chambers for a couple of months at center for cognitive behavior.    History is obtained by patient.  D/w Stephen Chambers.  He has basic inattentive ADD.  He would like to consider a moderate stimulant for treatment.   Review of Systems  Cardiovascular: Negative.   Psychiatric/Behavioral: Positive for decreased concentration. Negative for dysphoric mood. The patient is not nervous/anxious.     Patient Active Problem List   Diagnosis Date Noted  . Hx of colonic polyps 09/14/2015  . Internal hemorrhoids with fecal soiling and bleeding 08/09/2014  . Eosinophilic esophagitis 86/76/7209    Current Outpatient Medications on File Prior to Visit  Medication Sig Dispense Refill  . pantoprazole (PROTONIX) 40 MG tablet TAKE 1 TABLET BY MOUTH DAILY BEFORE BREAKFAST 90 tablet 0   No current facility-administered medications on file prior to visit.     No Known Allergies  Past Medical History:  Diagnosis Date  . Eosinophilic esophagitis 11/03/960  . Hx of colonic polyps 09/14/2015  . Internal hemorrhoids with fecal soiling and bleeding 08/09/2014  . 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   2-3 caffeinated beverages a day and 2 alcoholic beverages a day as of 12/30/2012.   Social History   Tobacco Use  . Smoking status: Never Smoker  . Smokeless tobacco: Never Used  Substance Use Topics  . Alcohol use: Yes  . Drug use: No   family history includes Colon cancer in his paternal grandfather.     Objective:  BP 104/60   Pulse 85   Temp 98.5 F (36.9 C) (Oral)   Resp 18   Ht 5\' 9"  (1.753 m)   Wt 170 lb 3.2 oz (77.2 kg)   SpO2 98%   BMI 25.13 kg/m  Body mass index is 25.13 kg/m.  Physical Exam  Constitutional: He is oriented to person, place, and time and well-developed, well-nourished, and in no distress.  HENT:  Head: Normocephalic and atraumatic.  Right Ear: External ear normal.  Left Ear: External ear normal.  Eyes: Conjunctivae are normal.  Neck: Normal range of motion.  Cardiovascular: Normal rate, regular rhythm and normal heart sounds.  No murmur heard. Pulmonary/Chest: Effort normal and breath sounds normal. He has no wheezes.  Neurological: He is alert and oriented to person, place, and time. Gait normal.  Skin: Skin is warm and dry.  Psychiatric: Mood, memory, affect and judgment normal.    Assessment and Plan :  Inattention - Plan: methylphenidate (METADATE CD) 10 MG CR  capsule  Pt will be started on low dose stimulant - he was advised of what to look for - what changes he might expect and to be aware of the time that he is helped with the medication.  We will start at a low dose and then increase based on need.  He will continue treatment with therapist and recheck with me in 1 month.  Stephen Hummingbird PA-C  [facility] at [facility] 10/11/2017 5:07 PM

## 2017-10-11 NOTE — Patient Instructions (Addendum)
Please download the APP called mychart - then use the text to activate this APP - this will allow you to look at your labs and contact me as well as make appointments to see me in the future.     IF you received an x-ray today, you will receive an invoice from [facility]. Please contact [facility] at [phone] with questions or concerns regarding your invoice.   IF you received labwork today, you will receive an invoice from [facility]. Please contact [facility] at [phone] with questions or concerns regarding your invoice.   Our billing staff will not be able to assist you with questions regarding bills from these companies.  You will be contacted with the lab results as soon as they are available. The fastest way to get your results is to activate your My Chart account. Instructions are located on the last page of this paperwork. If you have not heard from us regarding the results in 2 weeks, please contact this office.     

## 2017-10-15 DIAGNOSIS — H93291 Other abnormal auditory perceptions, right ear: Secondary | ICD-10-CM | POA: Diagnosis not present

## 2017-11-08 ENCOUNTER — Encounter: Payer: Self-pay | Admitting: Physician Assistant

## 2017-11-08 ENCOUNTER — Ambulatory Visit: Payer: BLUE CROSS/BLUE SHIELD | Admitting: Physician Assistant

## 2017-11-08 ENCOUNTER — Other Ambulatory Visit: Payer: Self-pay

## 2017-11-08 VITALS — BP 102/60 | HR 75 | Temp 98.4°F | Resp 18 | Ht 69.0 in | Wt 170.2 lb

## 2017-11-08 DIAGNOSIS — R41840 Attention and concentration deficit: Secondary | ICD-10-CM

## 2017-11-08 MED ORDER — METHYLPHENIDATE HCL ER (OSM) 18 MG PO TBCR: 18.0000 mg | EXTENDED_RELEASE_TABLET | Freq: Every day | ORAL | 0 refills | Status: DC

## 2017-11-08 NOTE — Patient Instructions (Addendum)
  Naphcon or zaditor over the counter eye drops   IF you received an x-ray today, you will receive an invoice from [facility]. Please contact [facility] at [phone] with questions or concerns regarding your invoice.   IF you received labwork today, you will receive an invoice from [facility]. Please contact [facility] at [phone] with questions or concerns regarding your invoice.   Our billing staff will not be able to assist you with questions regarding bills from these companies.  You will be contacted with the lab results as soon as they are available. The fastest way to get your results is to activate your My Chart account. Instructions are located on the last page of this paperwork. If you have not heard from [place] regarding the results in 2 weeks, please contact this office.

## 2017-11-08 NOTE — Progress Notes (Signed)
Stephen Chambers  MRN: [id number] DOB: [birth year]  PCP: [name], MD  Chief Complaint  Patient presents with  . Medication Refill    ADD med check     Subjective:  Pt presents to clinic for medication adjustment.    Felt less scattered on the 10mg  Methylphenidate - did not notice much improvement when he went to 20mg  but he was on vacation - then he was only able to try a 30mg  for 1 day but on that day he did feel like he was on a medication but he does not know if it was the medication or if it was just that day.  He is not completely sure what he needs to be looking for to see if the medication is helping him.  He continues to see therapist who is working with him and his family on communication and is helping him to recognize how the medication should help him.  History is obtained by patient.  Review of Systems  Constitutional: Negative for appetite change, chills, fever and unexpected weight change.  Respiratory: Negative for cough.   Cardiovascular: Negative for chest pain and leg swelling.  Psychiatric/Behavioral: Negative for sleep disturbance.    Patient Active Problem List   Diagnosis Date Noted  . Hx of colonic polyps 09/14/2015  . Internal hemorrhoids with fecal soiling and bleeding 08/09/2014  . Eosinophilic esophagitis 86/76/1950    Current Outpatient Medications on File Prior to Visit  Medication Sig Dispense Refill  . pantoprazole (PROTONIX) 40 MG tablet TAKE 1 TABLET BY MOUTH DAILY BEFORE BREAKFAST 90 tablet 0   No current facility-administered medications on file prior to visit.     No Known Allergies  Past Medical History:  Diagnosis Date  . Eosinophilic esophagitis 04/01/2670  . Hx of colonic polyps 09/14/2015  . Internal hemorrhoids with fecal soiling and bleeding 08/09/2014  . 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   2-3 caffeinated beverages a day and 2 alcoholic beverages a day as of 12/30/2012.   Social History   Tobacco Use  . Smoking status: Never Smoker  . Smokeless tobacco: Never Used  Substance Use Topics  . Alcohol use: Yes  . Drug use: No   family history includes Colon cancer in his paternal grandfather.     Objective:  BP 102/60   Pulse 75   Temp 98.4 F (36.9 C) (Oral)   Resp 18   Ht 5\' 9"  (1.753 m)   Wt 170 lb 3.2 oz (77.2 kg)   SpO2 99%   BMI 25.13 kg/m  Body mass index is 25.13 kg/m.  Physical Exam  Constitutional: He is oriented to person, place, and time.  HENT:  Head: Normocephalic and atraumatic.  Right Ear: External ear normal.  Left Ear: External ear normal.  Eyes: Conjunctivae are normal.  Neck: Normal range of motion.  Cardiovascular: Normal rate, regular rhythm and normal heart sounds.  No murmur heard. Pulmonary/Chest: Effort normal and breath sounds normal. He has no wheezes.  Neurological: He is alert and oriented to person, place, and time.  Skin: Skin is warm and dry.  Psychiatric: Judgment normal.    Assessment and Plan :  Inattention - Plan: methylphenidate (CONCERTA) 18 MG PO CR tablet   Change medication - d/w pt things that he will be looking for - due to his not being a  severe ADD he will likely have to be more aware of when it starts to work and those around him may be able to help but communication in his family is something that they are not good at though they are working towards improving it. He will recheck with me in 6 weeks.  Windell Hummingbird PA-C  [facility] at [facility] 11/08/2017 10:06 AM

## 2017-11-28 ENCOUNTER — Telehealth: Payer: Self-pay | Admitting: Physician Assistant

## 2017-11-28 MED ORDER — AMPHETAMINE-DEXTROAMPHET ER 10 MG PO CP24: 10.0000 mg | ORAL_CAPSULE | Freq: Every day | ORAL | 0 refills | Status: DC

## 2017-11-28 NOTE — Telephone Encounter (Signed)
Got a call from Dr Pablo Ledger - he has not responded to Concerta or methylphenidate.  Trial of Adderall Xr 10mg  with the ability to go to Adderall 20mg 

## 2017-12-04 ENCOUNTER — Other Ambulatory Visit: Payer: Self-pay

## 2017-12-04 MED ORDER — PANTOPRAZOLE SODIUM 40 MG PO TBEC: DELAYED_RELEASE_TABLET | ORAL | 0 refills | Status: DC

## 2017-12-04 NOTE — Telephone Encounter (Signed)
Pantoprazole 40mg  tablets refilled and put note on refill to call for appt.

## 2017-12-20 ENCOUNTER — Encounter: Payer: Self-pay | Admitting: Physician Assistant

## 2017-12-20 ENCOUNTER — Ambulatory Visit: Payer: BLUE CROSS/BLUE SHIELD | Admitting: Physician Assistant

## 2017-12-20 ENCOUNTER — Other Ambulatory Visit: Payer: Self-pay

## 2017-12-20 VITALS — BP 102/60 | HR 85 | Temp 98.0°F | Resp 18 | Ht 69.0 in | Wt 165.4 lb

## 2017-12-20 DIAGNOSIS — R41840 Attention and concentration deficit: Secondary | ICD-10-CM | POA: Diagnosis not present

## 2017-12-20 MED ORDER — LISDEXAMFETAMINE DIMESYLATE 10 MG PO CAPS: 10.0000 mg | ORAL_CAPSULE | Freq: Every day | ORAL | 0 refills | Status: DC

## 2017-12-20 NOTE — Progress Notes (Signed)
Stephen Chambers  MRN: [id number] DOB: 04/17/1966  PCP: [name], MD  Chief Complaint  Patient presents with  . Medication Refill    medication check for ADD     Subjective:  Pt presents to clinic for medication recheck.  He started on methylphenidate and titrating up without any relief.  He was then switched to Concerta and titrated that up without any relief.  He was switched to Adderall XR 10 mg titrated up to his current dose of Adderall XR 20 mg.  He has felt no relief with any of these medications.  He felt the first several days on the methylphenidate 10 mg was probably most but now that he looks back he thinks it may have been better.  He has had no problems with anxiety on these medications.  When he gets to the high dose of the concerned that he did feel some heart palpitations the first day or so of that higher dose but then it was.  Still would like to have some control of his ADD at work as well as at home.  He has continued to see Dr. Pablo Ledger at [facility] for therapy for titration and evaluation of medication results.  He has an appointment next week with him.    History is obtained by patient.  Review of Systems  Constitutional: Negative for appetite change, chills, fever and unexpected weight change.  Respiratory: Negative for cough.   Cardiovascular: Negative for chest pain and leg swelling.  Psychiatric/Behavioral: Negative for sleep disturbance.    Patient Active Problem List   Diagnosis Date Noted  . Hx of colonic polyps 09/14/2015  . Internal hemorrhoids with fecal soiling and bleeding 08/09/2014  . Eosinophilic esophagitis 78/29/5621    Current Outpatient Medications on File Prior to Visit  Medication Sig Dispense Refill  . amphetamine-dextroamphetamine (ADDERALL XR) 10 MG 24 hr capsule Take 1 capsule (10 mg total) by mouth daily. 30 capsule 0  . methylphenidate (CONCERTA) 18 MG PO CR tablet Take 1 tablet (18 mg total) by mouth daily. 30 tablet 0  .  pantoprazole (PROTONIX) 40 MG tablet TAKE 1 TABLET BY MOUTH DAILY BEFORE BREAKFAST 90 tablet 0   No current facility-administered medications on file prior to visit.     No Known Allergies  Past Medical History:  Diagnosis Date  . Eosinophilic esophagitis 3/0/8657  . Hx of colonic polyps 09/14/2015  . Internal hemorrhoids with fecal soiling and bleeding 08/09/2014  . 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   2-3 caffeinated beverages a day and 2 alcoholic beverages a day as of 12/30/2012.   Social History   Tobacco Use  . Smoking status: Never Smoker  . Smokeless tobacco: Never Used  Substance Use Topics  . Alcohol use: Yes  . Drug use: No   family history includes Colon cancer in his paternal grandfather.     Objective:  BP 102/60   Pulse 85   Temp 98 F (36.7 C) (Oral)   Resp 18   Ht 5\' 9"  (1.753 m)   Wt 165 lb 6.4 oz (75 kg)   SpO2 100%   BMI 24.43 kg/m  Body mass index is 24.43 kg/m.  Physical Exam  Constitutional: He is oriented to person, place, and time.  HENT:  Head: Normocephalic and atraumatic.  Right Ear: External ear normal.  Left Ear:  External ear normal.  Eyes: Conjunctivae are normal.  Neck: Normal range of motion.  Cardiovascular: Normal rate, regular rhythm and normal heart sounds.  No murmur heard. Pulmonary/Chest: Effort normal and breath sounds normal. He has no wheezes.  Neurological: He is alert and oriented to person, place, and time.  Skin: Skin is warm and dry.  Psychiatric: Judgment normal.    Assessment and Plan :  Inattention - Plan: lisdexamfetamine (VYVANSE) 10 MG capsule -patient has been on 3 medications without results and improvement in his ADD symptoms.  We will try Vyvanse.  He was discussed titrating up to 40 mg and then letting me know his success.  He will continue therapy with Dr. Pablo Ledger.  If  he gets good relief with Vyvanse we will check him back in 3 months and prescribe what has worked well for him throughout that time.  If he gets no relief from the Vyvanse and titration of we will check him back in 6 weeks.  Windell Hummingbird PA-C  [facility] 12/20/2017 12:51 PM

## 2017-12-20 NOTE — Patient Instructions (Signed)
     IF you received an x-ray today, you will receive an invoice from [facility]. Please contact [facility] at [phone] with questions or concerns regarding your invoice.   IF you received labwork today, you will receive an invoice from [facility]. Please contact [facility] at [phone] with questions or concerns regarding your invoice.   Our billing staff will not be able to assist you with questions regarding bills from these companies.  You will be contacted with the lab results as soon as they are available. The fastest way to get your results is to activate your My Chart account. Instructions are located on the last page of this paperwork. If you have not heard from us regarding the results in 2 weeks, please contact this office.     

## 2018-01-10 ENCOUNTER — Encounter: Payer: Self-pay | Admitting: Physician Assistant

## 2018-01-10 DIAGNOSIS — R41840 Attention and concentration deficit: Secondary | ICD-10-CM

## 2018-01-11 ENCOUNTER — Other Ambulatory Visit: Payer: Self-pay | Admitting: Physician Assistant

## 2018-01-11 DIAGNOSIS — R41840 Attention and concentration deficit: Secondary | ICD-10-CM

## 2018-01-11 MED ORDER — LISDEXAMFETAMINE DIMESYLATE 20 MG PO CAPS: 20.0000 mg | ORAL_CAPSULE | Freq: Every day | ORAL | 0 refills | Status: DC

## 2018-01-21 ENCOUNTER — Encounter: Payer: Self-pay | Admitting: Physician Assistant

## 2018-01-21 DIAGNOSIS — R41840 Attention and concentration deficit: Secondary | ICD-10-CM

## 2018-01-22 ENCOUNTER — Telehealth: Payer: Self-pay | Admitting: *Deleted

## 2018-01-22 MED ORDER — LISDEXAMFETAMINE DIMESYLATE 20 MG PO CAPS: 20.0000 mg | ORAL_CAPSULE | Freq: Every day | ORAL | 0 refills | Status: DC

## 2018-01-22 NOTE — Telephone Encounter (Signed)
Pa  Key: [id number]

## 2018-02-05 MED ORDER — LISDEXAMFETAMINE DIMESYLATE 60 MG PO CAPS: 60.0000 mg | ORAL_CAPSULE | ORAL | 0 refills | Status: DC

## 2018-02-05 NOTE — Telephone Encounter (Signed)
Got a call from Dr Pablo Ledger that the patient is doing well on Vyvanse 60mg  - needs a Rx

## 2018-02-11 DIAGNOSIS — Z23 Encounter for immunization: Secondary | ICD-10-CM | POA: Diagnosis not present

## 2018-02-11 DIAGNOSIS — Z0000 Encounter for general adult medical examination without abnormal findings: Secondary | ICD-10-CM | POA: Diagnosis not present

## 2018-02-11 DIAGNOSIS — Z125 Encounter for screening for malignant neoplasm of prostate: Secondary | ICD-10-CM | POA: Diagnosis not present

## 2018-03-07 ENCOUNTER — Other Ambulatory Visit: Payer: Self-pay

## 2018-03-07 MED ORDER — PANTOPRAZOLE SODIUM 40 MG PO TBEC: DELAYED_RELEASE_TABLET | ORAL | 0 refills | Status: DC

## 2018-03-07 NOTE — Telephone Encounter (Signed)
Pantoprazole refilled as requested. 

## 2018-06-02 ENCOUNTER — Other Ambulatory Visit: Payer: Self-pay | Admitting: Internal Medicine

## 2018-07-14 ENCOUNTER — Telehealth: Payer: Self-pay | Admitting: Internal Medicine

## 2018-07-14 MED ORDER — PANTOPRAZOLE SODIUM 40 MG PO TBEC: DELAYED_RELEASE_TABLET | ORAL | 0 refills | Status: DC

## 2018-07-14 NOTE — Telephone Encounter (Signed)
Refilled his pantoprazole and noted to call for appointment.

## 2018-09-24 DIAGNOSIS — M7711 Lateral epicondylitis, right elbow: Secondary | ICD-10-CM | POA: Diagnosis not present

## 2018-09-24 DIAGNOSIS — M25521 Pain in right elbow: Secondary | ICD-10-CM | POA: Diagnosis not present

## 2018-10-08 ENCOUNTER — Other Ambulatory Visit: Payer: Self-pay | Admitting: Internal Medicine

## 2018-10-14 ENCOUNTER — Telehealth: Payer: Self-pay | Admitting: Internal Medicine

## 2018-10-14 MED ORDER — PANTOPRAZOLE SODIUM 40 MG PO TBEC: DELAYED_RELEASE_TABLET | ORAL | 0 refills | Status: DC

## 2018-10-14 NOTE — Telephone Encounter (Signed)
I spoke with Stephen Chambers and he has made an appointment so I sent in his refill for his pantoprazole. He was last seen in 2017.

## 2018-10-14 NOTE — Telephone Encounter (Signed)
Pt said that he got a msg that his medication was denied for pantoprazole (PROTONIX) 40 MG.  Patient was trying to get a refill

## 2018-11-10 ENCOUNTER — Encounter: Payer: Self-pay | Admitting: General Surgery

## 2018-11-11 ENCOUNTER — Other Ambulatory Visit: Payer: Self-pay

## 2018-11-11 ENCOUNTER — Ambulatory Visit (INDEPENDENT_AMBULATORY_CARE_PROVIDER_SITE_OTHER): Payer: BLUE CROSS/BLUE SHIELD | Admitting: Internal Medicine

## 2018-11-11 ENCOUNTER — Encounter: Payer: Self-pay | Admitting: Internal Medicine

## 2018-11-11 VITALS — Ht 69.0 in

## 2018-11-11 DIAGNOSIS — K200 Eosinophilic esophagitis: Secondary | ICD-10-CM | POA: Diagnosis not present

## 2018-11-11 DIAGNOSIS — K648 Other hemorrhoids: Secondary | ICD-10-CM | POA: Diagnosis not present

## 2018-11-11 MED ORDER — PANTOPRAZOLE SODIUM 20 MG PO TBEC: 20.0000 mg | DELAYED_RELEASE_TABLET | Freq: Every day | ORAL | 3 refills | Status: DC

## 2018-11-11 NOTE — Patient Instructions (Addendum)
Good to talk today.  As we discussed will try pantoprazole at a 20 mg dose daily.  If that does not control swallowing problems then we will go back to 40 mg, just let me know.   There is much written and talked about possible health problems with the use of PPI medications (omeprazole (Prilosec), esomeprazole (Nexium), rabeprazole (Aciphex), lansoprazole (Prevacid), Dexlansoprazole (Dexilant) and pantoprazole ( Protonix).  The truth is the studies that suggest health problems with the use of PPI medications only find a weak association at best with the possible diseases reported to occur like dementia, bone fracture and kidney failure and even death. Nany sicker patients with multiple health problems end up on these medications for various reasons. Establishing cause and effect in medical research is extremely difficult and though I believe there are very rare occurrences in which PPI's do cause harm in these ways, the benefits of you taking your PPI outweigh these largely theoretical and sometimes directly disproven risks.  As always, I/we will strive to have you on the lowest effective dose and frequency of a medication to keep you well and with a good quality of life.     Sorry the hemorrhoids are still an issue.  When Covid-19 restrictions stop will plan to reassess in person and see what might be done to help you.  I will place you on a list to be called but if you do not hear and you do hear in the news things are opening up in medical offices call us.  I appreciate the opportunity to care for you. Gatha Mayer, MD, [facility]

## 2018-11-11 NOTE — Assessment & Plan Note (Addendum)
Stay on PPI can refill up to 2 yrs w/o visit Will see if he can tolerate 20 mg w/o sig problems. Sounds like since some intermittent mild dysphagia still needs PPI. He wants to try lower dose - seems reasonable. Suspect acid rebound occurring when he gets heartburn off PPI. No prior hx GERD Sxs.

## 2018-11-11 NOTE — Assessment & Plan Note (Signed)
Banding so far has not solved his problems. He says can live with this but if can be fixed would like to try. Plan OV and anoscopy, functional rectal exam when Covid-19 restrictions over ? More banding ? If he needs anorectal mano or even PT

## 2018-11-11 NOTE — Progress Notes (Signed)
    TELEHEALTH ENCOUNTER IN SETTING OF COVID-19 PANDEMIC - REQUESTED BY PATIENT SERVICE PROVIDED BY TELEMEDECINE - TYPE: Webex PATIENT LOCATION: Home PATIENT HAS CONSENTED TO TELEHEALTH VISIT PROVIDER LOCATION: OFFICE PARTICIPANTS OTHER THAN PATIENT:none  TIME SPENT ON CALL:15 minutes    Stephen Chambers 53 y.o. 10-25-1965 [id number]  Assessment & Plan:   Eosinophilic esophagitis Stay on PPI can refill up to 2 yrs w/o visit Will see if he can tolerate 20 mg w/o sig problems. Sounds like since some intermittent mild dysphagia still needs PPI. He wants to try lower dose - seems reasonable. Suspect acid rebound occurring when he gets heartburn off PPI. No prior hx GERD Sxs.   Internal hemorrhoids with fecal soiling and bleeding Banding so far has not solved his problems. He says can live with this but if can be fixed would like to try. Plan OV and anoscopy, functional rectal exam when Covid-19 restrictions over ? More banding ? If he needs anorectal mano or even PT      Subjective:   Chief Complaint: hemorrhoids, some heartburn  HPI 53 yo wm seen in past for dysphagia that turned out to be EoE (48 Fr dilation and PPI started 2014) and also hemorrhoids (fecal seepage mainly, banded multiple x last 2017). He has noticed if he stops PPI will get some heartburn. Very mild occasional dysphagia but no sig impaction sxs.  As far as hemorrhoids he needs to plan to not run or exercise for hours after defecation due to cleansing and fecal seepage issues. Has some slight bleeding from time to time. Colonoscopy last 2017, right colon hyperplastic I suspect ssp. Also w/ hemorrhoids and banding scars. No Known Allergies Current Meds  Medication Sig  . Fexofenadine HCl (ALLEGRA ALLERGY PO) Take by mouth.  . pantoprazole (PROTONIX) 40 MG tablet One daily before breakfast.   Past Medical History:  Diagnosis Date  . Eosinophilic esophagitis 08/05/4942  . Hx of colonic polyps 09/14/2015  .  Internal hemorrhoids with fecal soiling and bleeding 08/09/2014  . Schatzki's ring 2005   Past Surgical History:  Procedure Laterality Date  . COLONOSCOPY W/ BIOPSIES    . ESOPHAGOGASTRODUODENOSCOPY  01/12/04  . HEMORRHOID BANDING     Social History   Social History Narrative   Married, 2 sons one daughter - all living at home   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   2-3 caffeinated beverages a day and 2 alcoholic beverages a day as of 12/30/2012.   family history includes Colon cancer in his paternal grandfather.   Review of Systems As per HPI

## 2019-02-09 DIAGNOSIS — Z20828 Contact with and (suspected) exposure to other viral communicable diseases: Secondary | ICD-10-CM | POA: Diagnosis not present

## 2019-03-31 DIAGNOSIS — Z0000 Encounter for general adult medical examination without abnormal findings: Secondary | ICD-10-CM | POA: Diagnosis not present

## 2019-04-01 DIAGNOSIS — B078 Other viral warts: Secondary | ICD-10-CM | POA: Diagnosis not present

## 2019-04-01 DIAGNOSIS — L821 Other seborrheic keratosis: Secondary | ICD-10-CM | POA: Diagnosis not present

## 2019-04-01 DIAGNOSIS — D1801 Hemangioma of skin and subcutaneous tissue: Secondary | ICD-10-CM | POA: Diagnosis not present

## 2019-04-01 DIAGNOSIS — L812 Freckles: Secondary | ICD-10-CM | POA: Diagnosis not present

## 2019-04-14 DIAGNOSIS — R3912 Poor urinary stream: Secondary | ICD-10-CM | POA: Diagnosis not present

## 2019-04-14 DIAGNOSIS — Z125 Encounter for screening for malignant neoplasm of prostate: Secondary | ICD-10-CM | POA: Diagnosis not present

## 2019-04-14 DIAGNOSIS — Z13220 Encounter for screening for lipoid disorders: Secondary | ICD-10-CM | POA: Diagnosis not present

## 2019-05-12 ENCOUNTER — Encounter: Payer: Self-pay | Admitting: Sports Medicine

## 2019-05-12 ENCOUNTER — Ambulatory Visit: Payer: Self-pay

## 2019-05-12 ENCOUNTER — Other Ambulatory Visit: Payer: Self-pay

## 2019-05-12 ENCOUNTER — Ambulatory Visit: Payer: BLUE CROSS/BLUE SHIELD | Admitting: Sports Medicine

## 2019-05-12 VITALS — BP 110/70 | Ht 70.0 in | Wt 165.0 lb

## 2019-05-12 DIAGNOSIS — M7711 Lateral epicondylitis, right elbow: Secondary | ICD-10-CM

## 2019-05-12 DIAGNOSIS — M25521 Pain in right elbow: Secondary | ICD-10-CM | POA: Diagnosis not present

## 2019-05-12 MED ORDER — NITROGLYCERIN 0.2 MG/HR TD PT24: MEDICATED_PATCH | TRANSDERMAL | 1 refills | Status: DC

## 2019-05-12 NOTE — Progress Notes (Signed)
CC: Right "tennis elbow"  Taking lessons and working on tennis game Developed RT lateral elbow pain in Feb. CSI by Dr. Veverly Fells helped pain a lot for 2 weeks Went back to playing and had sharp pain hitting a backhand in April Too painful to play after that  At first too painful to even hold a cup Gradually better but still hurts if elbow extended Too painful to RT tennis Comes for opinion  ROS Swallowing difficulty DX as eosinophilic esophagitis Uses pantoprazole and this has improved No other joint pain  PE Pleasant, athletic M in NAD BP 110/70   Ht 5\' 10"  (1.778 m)   Wt 165 lb (74.8 kg)   BMI 23.68 kg/m   TTP at lateral epicondyle No redness or swelling Pain with wrist extension or finger extension x resistance + book test Full extension but feels tight  Ultrasound of Right Lateral Elbow  Small avulsion at tip of epicondyle Spurring and bony irregularity at insertion of commeon extensor tendon at epicondyle Some hypoechoic splitting noted Marked neovessel flow  Impression: Lateral epicondylitis with small intersubtance tears of extensor tendons  Ultrasound and interpretation by Wolfgang Phoenix. Oneida Alar, MD

## 2019-05-12 NOTE — Patient Instructions (Signed)

## 2019-05-12 NOTE — Assessment & Plan Note (Signed)
NTG protocol  Extensor exercise protocol  Consider compression sleeve  Reck 6 to 8 weeks with repeat US

## 2019-05-28 ENCOUNTER — Ambulatory Visit: Payer: BLUE CROSS/BLUE SHIELD | Admitting: Sports Medicine

## 2019-07-01 ENCOUNTER — Other Ambulatory Visit: Payer: Self-pay

## 2019-07-01 ENCOUNTER — Encounter: Payer: Self-pay | Admitting: Family Medicine

## 2019-07-01 ENCOUNTER — Ambulatory Visit: Payer: BC Managed Care – PPO | Admitting: Family Medicine

## 2019-07-01 ENCOUNTER — Ambulatory Visit: Payer: Self-pay

## 2019-07-01 VITALS — BP 122/80 | Ht 70.0 in | Wt 170.0 lb

## 2019-07-01 DIAGNOSIS — M25521 Pain in right elbow: Secondary | ICD-10-CM

## 2019-07-01 NOTE — Progress Notes (Signed)
[facility] [address] [place], [place] [address] Phone: [phone] Fax: [phone]   Patient Name: Stephen Chambers Date of Birth: Jun 12, 1966 Medical Record Number: [id number] Gender: male Date of Encounter: 07/01/2019  CC: Follow-up right elbow pain  HPI: Patient is following up for right elbow pain.  2 months ago he was started on nitroglycerin protocol.  He feels there is some improvement, but he is still unable to play any racquet sports.  He denies any new injury, swelling, or radiating pain into his fingers.  Aggravating factors also include holding something heavy in his arm in full extension.  He describes feeling more of a stiffness than pain.   Past Medical History:  Diagnosis Date  . Eosinophilic esophagitis [id number]  . Hx of colonic polyps 09/14/2015  . Internal hemorrhoids with fecal soiling and bleeding 08/09/2014  . Schatzki's ring 2005    Current Outpatient Medications on File Prior to Visit  Medication Sig Dispense Refill  . Fexofenadine HCl (ALLEGRA ALLERGY PO) Take by mouth.    . nitroGLYCERIN (NITRODUR - DOSED IN MG/24 HR) 0.2 mg/hr patch Use 1/4 patch daily to the affected area. 30 patch 1  . pantoprazole (PROTONIX) 20 MG tablet Take 1 tablet (20 mg total) by mouth daily before breakfast. 30 mins before 90 tablet 3   No current facility-administered medications on file prior to visit.     Past Surgical History:  Procedure Laterality Date  . COLONOSCOPY W/ BIOPSIES    . ESOPHAGOGASTRODUODENOSCOPY  01/12/04  . HEMORRHOID BANDING      No Known Allergies  Social History   Socioeconomic History  . Marital status: Married    Spouse name: Not on file  . Number of children: 3  . Years of education: Not on file  . Highest education level: Not on file  Occupational History  . 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 Financial resource strain: Not on file  .  Food insecurity    Worry: Not on file    Inability: Not on file  . Transportation needs    Medical: Not on file    Non-medical: Not on file  Tobacco Use  . Smoking status: Never Smoker  . Smokeless tobacco: Never Used  Substance and Sexual Activity  . Alcohol use: Yes  . Drug use: No  . Sexual activity: Not on file  Lifestyle  . Physical activity    Days per week: Not on file    Minutes per session: Not on file  . Stress: Not on file  Relationships  . Social Herbalist on phone: Not on file    Gets together: Not on file    Attends religious service: Not on file    Active member of club or organization: Not on file    Attends meetings of clubs or organizations: Not on file    Relationship status: Not on file  . Intimate partner violence    Fear of current or ex partner: Not on file    Emotionally abused: Not on file    Physically abused: Not on file    Forced sexual activity: Not on file  Other Topics Concern  . Not on file  Social History Narrative   Married, 2 sons one daughter - all living at home   He owns a [employer].   2-3 caffeinated beverages a day and 2 alcoholic beverages a day  as of 12/30/2012.    Family History  Problem Relation Age of Onset  . Colon cancer Paternal Grandfather     BP 122/80   Ht 5\' 10"  (1.778 m)   Wt 170 lb (77.1 kg)   BMI 24.39 kg/m   ROS:  See HPI CONST: no F/C, no malaise, no fatigue MSK: See above NEURO: no numbness/tingling, no weakness SKIN: no rash, no lesions HEME: no bleeding, no bruising, no erythema  Objective: GEN: Alert and oriented, NAD Pulm: Breathing unlabored PSY: normal mood, congruent affect  Right elbow: Unremarkable to inspection. Range of motion full pronation, supination, flexion, extension. Decreased strength in wrist extension and 3rd digit extension with mild pain  Stable to varus, valgus stress. Negative moving valgus stress test. TTP at lateral epicondyle  Negative cubital tunnel Tinel's.  Limited MSK ultrasound Right elbow Improved neovascularization compared to prior images.  Small hyperechoic changes at origin of common extensor tendon.  Split tear appears healed.  Borderline mild effusion.  Impression: Lateral epicondylitis with evidence of healing  Assessment and Plan:  1.  Right lateral epicondylitis  Overall, there is objective evidence of healing on [place] today.  Continue home exercise program.  Recommend increasing nitroglycerin patches to 1/2 and move around placement of patches.  Avoid using left hand for carrying heavy objects if possible.  Lanier Clam, DO, ATC Sports Medicine Fellow

## 2019-07-01 NOTE — Patient Instructions (Signed)
Your ultrasound shows this is healing though the process is always slower than we would like it to be. Increase the nitro patches to 1/2 patch, change daily. Do home exercises (hammer rotation, wrist extensions) 3 sets of 10 once a day. Try to avoid painful activities as much as possible otherwise. Ice the area 3-4 times a day for 15 minutes at a time if needed. Tylenol or aleve as needed for pain. Follow up in 6 weeks.

## 2019-07-07 ENCOUNTER — Ambulatory Visit: Payer: BC Managed Care – PPO | Admitting: Sports Medicine

## 2019-07-15 ENCOUNTER — Ambulatory Visit: Payer: BC Managed Care – PPO | Attending: Internal Medicine

## 2019-07-15 ENCOUNTER — Other Ambulatory Visit: Payer: Self-pay

## 2019-07-15 DIAGNOSIS — Z20828 Contact with and (suspected) exposure to other viral communicable diseases: Secondary | ICD-10-CM | POA: Diagnosis not present

## 2019-07-15 DIAGNOSIS — Z20822 Contact with and (suspected) exposure to covid-19: Secondary | ICD-10-CM

## 2019-07-17 LAB — NOVEL CORONAVIRUS, NAA: SARS-CoV-2, NAA: NOT DETECTED

## 2019-07-31 DIAGNOSIS — J3489 Other specified disorders of nose and nasal sinuses: Secondary | ICD-10-CM | POA: Diagnosis not present

## 2019-07-31 DIAGNOSIS — Z20828 Contact with and (suspected) exposure to other viral communicable diseases: Secondary | ICD-10-CM | POA: Diagnosis not present

## 2019-08-12 ENCOUNTER — Ambulatory Visit (INDEPENDENT_AMBULATORY_CARE_PROVIDER_SITE_OTHER): Payer: BC Managed Care – PPO | Admitting: Family Medicine

## 2019-08-12 ENCOUNTER — Other Ambulatory Visit: Payer: Self-pay

## 2019-08-12 ENCOUNTER — Encounter: Payer: Self-pay | Admitting: Family Medicine

## 2019-08-12 VITALS — BP 110/70 | Ht 70.0 in | Wt 170.0 lb

## 2019-08-12 DIAGNOSIS — M25521 Pain in right elbow: Secondary | ICD-10-CM

## 2019-08-12 NOTE — Progress Notes (Signed)
PCP: [name], MD  Subjective:   HPI: Patient is a 54 y.o. male here for right elbow pain.  07/01/19: Patient is following up for right elbow pain.  2 months ago he was started on nitroglycerin protocol.  He feels there is some improvement, but he is still unable to play any racquet sports.  He denies any new injury, swelling, or radiating pain into his fingers.  Aggravating factors also include holding something heavy in his arm in full extension.  He describes feeling more of a stiffness than pain.  08/12/19: Patient reports he continues to improve slowly. Using nitro patches. States doing home exercises but not as much as he should be. Feels a hyperextension-like feeling in his elbow. No swelling, bruising.  Past Medical History:  Diagnosis Date  . Eosinophilic esophagitis [id number]  . Hx of colonic polyps 09/14/2015  . Internal hemorrhoids with fecal soiling and bleeding 08/09/2014  . Schatzki's ring 2005    Current Outpatient Medications on File Prior to Visit  Medication Sig Dispense Refill  . Fexofenadine HCl (ALLEGRA ALLERGY PO) Take by mouth.    . nitroGLYCERIN (NITRODUR - DOSED IN MG/24 HR) 0.2 mg/hr patch Use 1/4 patch daily to the affected area. 30 patch 1  . pantoprazole (PROTONIX) 20 MG tablet Take 1 tablet (20 mg total) by mouth daily before breakfast. 30 mins before 90 tablet 3   No current facility-administered medications on file prior to visit.    Past Surgical History:  Procedure Laterality Date  . COLONOSCOPY W/ BIOPSIES    . ESOPHAGOGASTRODUODENOSCOPY  01/12/04  . HEMORRHOID BANDING      No Known Allergies  Social History   Socioeconomic History  . Marital status: Married    Spouse name: Not on file  . Number of children: 3  . Years of education: Not on file  . Highest education level: Not on file  Occupational History  . 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 Smoking status: Never  Smoker  . Smokeless tobacco: Never Used  Substance and Sexual Activity  . Alcohol use: Yes  . Drug use: No  . Sexual activity: Not on file  Other Topics Concern  . Not on file  Social History Narrative   Married, 2 sons one daughter - all living at home   He owns a [employer].   2-3 caffeinated beverages a day and 2 alcoholic beverages a day as of 12/30/2012.   Social Determinants of Health   Financial Resource Strain:   . Difficulty of Paying Living Expenses: Not on file  Food Insecurity:   . Worried About Charity fundraiser in the Last Year: Not on file  . Ran Out of Food in the Last Year: Not on file  Transportation Needs:   . Lack of Transportation (Medical): Not on file  . Lack of Transportation (Non-Medical): Not on file  Physical Activity:   . Days of Exercise per Week: Not on file  . Minutes of Exercise per Session: Not on file  Stress:   . Feeling of Stress : Not on file  Social Connections:   . Frequency of Communication with Friends and Family: Not on file  . Frequency of Social Gatherings with Friends and Family: Not on file  . Attends Religious Services: Not on file  . Active Member of Clubs or Organizations: Not on file  . Attends Archivist Meetings: Not on file  .  Marital Status: Not on file  Intimate Partner Violence:   . Fear of Current or Ex-Partner: Not on file  . Emotionally Abused: Not on file  . Physically Abused: Not on file  . Sexually Abused: Not on file    Family History  Problem Relation Age of Onset  . Colon cancer Paternal Grandfather     BP 110/70   Ht 5\' 10"  (1.778 m)   Wt 170 lb (77.1 kg)   BMI 24.39 kg/m   Review of Systems: See HPI above.     Objective:  Physical Exam:  Gen: NAD, comfortable in exam room  Right elbow: No deformity, swelling, bruising. FROM with 5/5 strength.  Minimal pain with 3rd digit extension. No tenderness to palpation. NVI distally. Collateral ligaments intact.    Assessment & Plan:  1. Right elbow pain - 2/2 lateral epicondylitis.  Improving.  Continue home exercises and stretches.  He has been using nitro patches - plans to continue these for at least a few more weeks.  Discussed slow return to tennis and protocol.  F/u in 3 months or prn.

## 2019-08-20 DIAGNOSIS — R351 Nocturia: Secondary | ICD-10-CM | POA: Diagnosis not present

## 2019-08-20 DIAGNOSIS — R350 Frequency of micturition: Secondary | ICD-10-CM | POA: Diagnosis not present

## 2019-08-20 DIAGNOSIS — R3912 Poor urinary stream: Secondary | ICD-10-CM | POA: Diagnosis not present

## 2019-10-20 DIAGNOSIS — Z23 Encounter for immunization: Secondary | ICD-10-CM | POA: Diagnosis not present

## 2019-10-22 ENCOUNTER — Ambulatory Visit: Payer: BC Managed Care – PPO

## 2019-11-06 ENCOUNTER — Other Ambulatory Visit: Payer: Self-pay | Admitting: Pediatrics

## 2019-11-06 DIAGNOSIS — M7711 Lateral epicondylitis, right elbow: Secondary | ICD-10-CM

## 2019-11-06 MED ORDER — NITROGLYCERIN 0.2 MG/HR TD PT24: MEDICATED_PATCH | TRANSDERMAL | 1 refills | Status: DC

## 2019-11-17 DIAGNOSIS — Z23 Encounter for immunization: Secondary | ICD-10-CM | POA: Diagnosis not present

## 2019-11-20 ENCOUNTER — Other Ambulatory Visit: Payer: Self-pay | Admitting: Internal Medicine

## 2019-11-20 DIAGNOSIS — K200 Eosinophilic esophagitis: Secondary | ICD-10-CM

## 2020-04-06 ENCOUNTER — Other Ambulatory Visit: Payer: Self-pay | Admitting: Critical Care Medicine

## 2020-04-06 ENCOUNTER — Other Ambulatory Visit: Payer: BC Managed Care – PPO

## 2020-04-06 DIAGNOSIS — Z20822 Contact with and (suspected) exposure to covid-19: Secondary | ICD-10-CM

## 2020-04-08 LAB — NOVEL CORONAVIRUS, NAA: SARS-CoV-2, NAA: NOT DETECTED

## 2020-04-08 LAB — SARS-COV-2, NAA 2 DAY TAT

## 2020-04-21 DIAGNOSIS — M9901 Segmental and somatic dysfunction of cervical region: Secondary | ICD-10-CM | POA: Diagnosis not present

## 2020-04-21 DIAGNOSIS — M7541 Impingement syndrome of right shoulder: Secondary | ICD-10-CM | POA: Diagnosis not present

## 2020-04-21 DIAGNOSIS — M9902 Segmental and somatic dysfunction of thoracic region: Secondary | ICD-10-CM | POA: Diagnosis not present

## 2020-04-21 DIAGNOSIS — M7711 Lateral epicondylitis, right elbow: Secondary | ICD-10-CM | POA: Diagnosis not present

## 2020-04-28 DIAGNOSIS — M7711 Lateral epicondylitis, right elbow: Secondary | ICD-10-CM | POA: Diagnosis not present

## 2020-04-28 DIAGNOSIS — M9902 Segmental and somatic dysfunction of thoracic region: Secondary | ICD-10-CM | POA: Diagnosis not present

## 2020-04-28 DIAGNOSIS — M9901 Segmental and somatic dysfunction of cervical region: Secondary | ICD-10-CM | POA: Diagnosis not present

## 2020-04-28 DIAGNOSIS — M7541 Impingement syndrome of right shoulder: Secondary | ICD-10-CM | POA: Diagnosis not present

## 2020-05-10 DIAGNOSIS — M9901 Segmental and somatic dysfunction of cervical region: Secondary | ICD-10-CM | POA: Diagnosis not present

## 2020-05-10 DIAGNOSIS — M7541 Impingement syndrome of right shoulder: Secondary | ICD-10-CM | POA: Diagnosis not present

## 2020-05-10 DIAGNOSIS — M7711 Lateral epicondylitis, right elbow: Secondary | ICD-10-CM | POA: Diagnosis not present

## 2020-05-10 DIAGNOSIS — M9902 Segmental and somatic dysfunction of thoracic region: Secondary | ICD-10-CM | POA: Diagnosis not present

## 2020-05-26 DIAGNOSIS — M9901 Segmental and somatic dysfunction of cervical region: Secondary | ICD-10-CM | POA: Diagnosis not present

## 2020-05-26 DIAGNOSIS — M9902 Segmental and somatic dysfunction of thoracic region: Secondary | ICD-10-CM | POA: Diagnosis not present

## 2020-05-26 DIAGNOSIS — M7541 Impingement syndrome of right shoulder: Secondary | ICD-10-CM | POA: Diagnosis not present

## 2020-05-26 DIAGNOSIS — M7711 Lateral epicondylitis, right elbow: Secondary | ICD-10-CM | POA: Diagnosis not present

## 2020-06-03 DIAGNOSIS — M9902 Segmental and somatic dysfunction of thoracic region: Secondary | ICD-10-CM | POA: Diagnosis not present

## 2020-06-03 DIAGNOSIS — Z125 Encounter for screening for malignant neoplasm of prostate: Secondary | ICD-10-CM | POA: Diagnosis not present

## 2020-06-03 DIAGNOSIS — M7711 Lateral epicondylitis, right elbow: Secondary | ICD-10-CM | POA: Diagnosis not present

## 2020-06-03 DIAGNOSIS — M9901 Segmental and somatic dysfunction of cervical region: Secondary | ICD-10-CM | POA: Diagnosis not present

## 2020-06-03 DIAGNOSIS — M7541 Impingement syndrome of right shoulder: Secondary | ICD-10-CM | POA: Diagnosis not present

## 2020-06-03 DIAGNOSIS — Z0000 Encounter for general adult medical examination without abnormal findings: Secondary | ICD-10-CM | POA: Diagnosis not present

## 2020-06-03 DIAGNOSIS — Z23 Encounter for immunization: Secondary | ICD-10-CM | POA: Diagnosis not present

## 2020-06-14 DIAGNOSIS — D225 Melanocytic nevi of trunk: Secondary | ICD-10-CM | POA: Diagnosis not present

## 2020-06-14 DIAGNOSIS — L308 Other specified dermatitis: Secondary | ICD-10-CM | POA: Diagnosis not present

## 2020-06-14 DIAGNOSIS — L812 Freckles: Secondary | ICD-10-CM | POA: Diagnosis not present

## 2020-06-14 DIAGNOSIS — L821 Other seborrheic keratosis: Secondary | ICD-10-CM | POA: Diagnosis not present

## 2020-06-16 DIAGNOSIS — M7541 Impingement syndrome of right shoulder: Secondary | ICD-10-CM | POA: Diagnosis not present

## 2020-06-16 DIAGNOSIS — M9901 Segmental and somatic dysfunction of cervical region: Secondary | ICD-10-CM | POA: Diagnosis not present

## 2020-06-16 DIAGNOSIS — M9902 Segmental and somatic dysfunction of thoracic region: Secondary | ICD-10-CM | POA: Diagnosis not present

## 2020-06-16 DIAGNOSIS — M7711 Lateral epicondylitis, right elbow: Secondary | ICD-10-CM | POA: Diagnosis not present

## 2020-07-04 DIAGNOSIS — M9901 Segmental and somatic dysfunction of cervical region: Secondary | ICD-10-CM | POA: Diagnosis not present

## 2020-07-04 DIAGNOSIS — M9902 Segmental and somatic dysfunction of thoracic region: Secondary | ICD-10-CM | POA: Diagnosis not present

## 2020-07-04 DIAGNOSIS — M7711 Lateral epicondylitis, right elbow: Secondary | ICD-10-CM | POA: Diagnosis not present

## 2020-07-04 DIAGNOSIS — M7541 Impingement syndrome of right shoulder: Secondary | ICD-10-CM | POA: Diagnosis not present

## 2020-10-21 DIAGNOSIS — R351 Nocturia: Secondary | ICD-10-CM | POA: Diagnosis not present

## 2020-10-21 DIAGNOSIS — N5201 Erectile dysfunction due to arterial insufficiency: Secondary | ICD-10-CM | POA: Diagnosis not present

## 2020-10-21 DIAGNOSIS — R350 Frequency of micturition: Secondary | ICD-10-CM | POA: Diagnosis not present

## 2020-11-26 ENCOUNTER — Other Ambulatory Visit: Payer: Self-pay | Admitting: Internal Medicine

## 2020-11-26 DIAGNOSIS — K200 Eosinophilic esophagitis: Secondary | ICD-10-CM

## 2020-12-27 ENCOUNTER — Other Ambulatory Visit: Payer: Self-pay

## 2020-12-27 ENCOUNTER — Ambulatory Visit (AMBULATORY_SURGERY_CENTER): Payer: Self-pay | Admitting: *Deleted

## 2020-12-27 VITALS — Ht 70.0 in | Wt 175.0 lb

## 2020-12-27 DIAGNOSIS — Z860100 Personal history of colon polyps, unspecified: Secondary | ICD-10-CM

## 2020-12-27 NOTE — Progress Notes (Signed)

## 2021-01-09 ENCOUNTER — Encounter: Payer: Self-pay | Admitting: Internal Medicine

## 2021-01-10 ENCOUNTER — Encounter: Payer: Self-pay | Admitting: Internal Medicine

## 2021-01-10 ENCOUNTER — Other Ambulatory Visit: Payer: Self-pay

## 2021-01-10 ENCOUNTER — Ambulatory Visit (AMBULATORY_SURGERY_CENTER): Payer: BC Managed Care – PPO | Admitting: Internal Medicine

## 2021-01-10 VITALS — BP 103/65 | HR 54 | Temp 98.6°F | Resp 15 | Ht 70.0 in | Wt 175.0 lb

## 2021-01-10 DIAGNOSIS — Z1211 Encounter for screening for malignant neoplasm of colon: Secondary | ICD-10-CM | POA: Diagnosis not present

## 2021-01-10 DIAGNOSIS — Z86010 Personal history of colonic polyps: Secondary | ICD-10-CM | POA: Diagnosis not present

## 2021-01-10 DIAGNOSIS — K635 Polyp of colon: Secondary | ICD-10-CM | POA: Diagnosis not present

## 2021-01-10 DIAGNOSIS — D125 Benign neoplasm of sigmoid colon: Secondary | ICD-10-CM

## 2021-01-10 MED ORDER — SODIUM CHLORIDE 0.9 % IV SOLN: 500.0000 mL | INTRAVENOUS | Status: DC

## 2021-01-10 NOTE — Progress Notes (Signed)
Called to room to assist during endoscopic procedure.  Patient ID and intended procedure confirmed with present staff. Received instructions for my participation in the procedure from the performing physician.  

## 2021-01-10 NOTE — Progress Notes (Signed)
VS by CW  I have reviewed the patient's medical history in detail and updated the computerized patient record.  

## 2021-01-10 NOTE — Progress Notes (Signed)
Report to PACU, RN, vss, BBS= Clear.  

## 2021-01-10 NOTE — Patient Instructions (Addendum)
I found and removed a very tony polyp (1). I will let you know pathology results and when to have another routine colonoscopy by mail and/or My Chart.  Will be at least 7 years, I think.  I appreciate the opportunity to care for you.  Gatha Mayer, MD, FACG   YOU HAD AN ENDOSCOPIC PROCEDURE TODAY AT [facility]:   Refer to the procedure report that was given to you for any specific questions about what was found during the examination.  If the procedure report does not answer your questions, please call your gastroenterologist to clarify.  If you requested that your care partner not be given the details of your procedure findings, then the procedure report has been included in a sealed envelope for you to review at your convenience later.  YOU SHOULD EXPECT: Some feelings of bloating in the abdomen. Passage of more gas than usual.  Walking can help get rid of the air that was put into your GI tract during the procedure and reduce the bloating. If you had a lower endoscopy (such as a colonoscopy or flexible sigmoidoscopy) you may notice spotting of blood in your stool or on the toilet paper. If you underwent a bowel prep for your procedure, you may not have a normal bowel movement for a few days.  Please Note:  You might notice some irritation and congestion in your nose or some drainage.  This is from the oxygen used during your procedure.  There is no need for concern and it should clear up in a day or so.  SYMPTOMS TO REPORT IMMEDIATELY:  Following lower endoscopy (colonoscopy or flexible sigmoidoscopy):  Excessive amounts of blood in the stool  Significant tenderness or worsening of abdominal pains  Swelling of the abdomen that is new, acute  Fever of 100F or higher   For urgent or emergent issues, a gastroenterologist can be reached at any hour by calling [phone]. Do not use MyChart messaging for urgent concerns.    DIET:  We do recommend a small meal at  first, but then you may proceed to your regular diet.  Drink plenty of fluids but you should avoid alcoholic beverages for 24 hours.  ACTIVITY:  You should plan to take it easy for the rest of today and you should NOT DRIVE or use heavy machinery until tomorrow (because of the sedation medicines used during the test).    FOLLOW UP: Our staff will call the number listed on your records 48-72 hours following your procedure to check on you and address any questions or concerns that you may have regarding the information given to you following your procedure. If we do not reach you, we will leave a message.  We will attempt to reach you two times.  During this call, we will ask if you have developed any symptoms of COVID 19. If you develop any symptoms (ie: fever, flu-like symptoms, shortness of breath, cough etc.) before then, please call [phone].  If you test positive for Covid 19 in the 2 weeks post procedure, please call and report this information to [place].    If any biopsies were taken you will be contacted by phone or by letter within the next 1-3 weeks.  Please call us at [phone] if you have not heard about the biopsies in 3 weeks.    SIGNATURES/CONFIDENTIALITY: You and/or your care partner have signed paperwork which will be entered into your electronic medical record.  These signatures attest  to the fact that that the information above on your After Visit Summary has been reviewed and is understood.  Full responsibility of the confidentiality of this discharge information lies with you and/or your care-partner.    Resume medications.Information given on polyps.

## 2021-01-10 NOTE — Op Note (Signed)
[facility] Patient Name: Stephen Chambers Procedure Date: 01/10/2021 8:46 AM MRN: [id number] Endoscopist: Gatha Mayer , MD Age: 55 Referring MD:  Date of Birth: [birth year] Gender: Male Account #: [id number] Procedure:                Colonoscopy Indications:              High risk colon cancer surveillance: Personal                            history of colonic polyps, Last colonoscopy: 2017 2                            serrated polyps from proximal colon Medicines:                Propofol per Anesthesia, Monitored Anesthesia Care Procedure:                Pre-Anesthesia Assessment:                           - Prior to the procedure, a History and Physical                            was performed, and patient medications and                            allergies were reviewed. The patient's tolerance of                            previous anesthesia was also reviewed. The risks                            and benefits of the procedure and the sedation                            options and risks were discussed with the patient.                            All questions were answered, and informed consent                            was obtained. Prior Anticoagulants: The patient has                            taken no previous anticoagulant or antiplatelet                            agents. ASA Grade Assessment: II - A patient with                            mild systemic disease. After reviewing the risks                            and benefits, the patient was deemed in  satisfactory condition to undergo the procedure.                           After obtaining informed consent, the colonoscope                            was passed under direct vision. Throughout the                            procedure, the patient's blood pressure, pulse, and                            oxygen saturations were monitored continuously. The                             Olympus [id number]) [id number] was introduced                            through the anus and advanced to the the cecum,                            identified by appendiceal orifice and ileocecal                            valve. The colonoscopy was performed without                            difficulty. The patient tolerated the procedure                            well. The quality of the bowel preparation was                            good. The bowel preparation used was Miralax via                            split dose instruction. The ileocecal valve,                            appendiceal orifice, and rectum were photographed. Scope In: 8:59:25 AM Scope Out: 9:10:57 AM Scope Withdrawal Time: 0 hours 8 minutes 15 seconds  Total Procedure Duration: 0 hours 11 minutes 32 seconds  Findings:                 The perianal and digital rectal examinations were                            normal. Pertinent negatives include normal prostate                            (size, shape, and consistency).                           A diminutive polyp was found in the sigmoid colon.  The polyp was sessile. The polyp was removed with a                            cold snare. Resection and retrieval were complete.                            Verification of patient identification for the                            specimen was done. Estimated blood loss was minimal.                           The exam was otherwise without abnormality on                            direct and retroflexion views. Complications:            No immediate complications. Estimated Blood Loss:     Estimated blood loss was minimal. Impression:               - One diminutive polyp in the sigmoid colon,                            removed with a cold snare. Resected and retrieved.                           - The examination was otherwise normal on direct                            and retroflexion  views.                           - Personal history of colonic polyps. 2 diminutive                            serrated lesions removed 2017 Recommendation:           - Patient has a contact number available for                            emergencies. The signs and symptoms of potential                            delayed complications were discussed with the                            patient. Return to normal activities tomorrow.                            Written discharge instructions were provided to the                            patient.                           - Resume  previous diet.                           - Continue present medications.                           - Repeat colonoscopy is recommended. The                            colonoscopy date will be determined after pathology                            results from today's exam become available for                            review. Gatha Mayer, MD 01/10/2021 9:18:47 AM This report has been signed electronically.

## 2021-01-12 ENCOUNTER — Telehealth: Payer: Self-pay | Admitting: *Deleted

## 2021-01-12 NOTE — Telephone Encounter (Signed)
  Follow up Call-  Call back number 01/10/2021  Post procedure Call Back phone  # [phone]  Permission to leave phone message Yes  Some recent data might be hidden     Patient questions:  Do you have a fever, pain , or abdominal swelling? No. Pain Score  0 *  Have you tolerated food without any problems? Yes.    Have you been able to return to your normal activities? Yes.    Do you have any questions about your discharge instructions: Diet   No. Medications  No. Follow up visit  No.  Do you have questions or concerns about your Care? No.  Actions: * If pain score is 4 or above: No action needed, pain <4.  Have you developed a fever since your procedure? no  2.   Have you had an respiratory symptoms (SOB or cough) since your procedure? no  3.   Have you tested positive for COVID 19 since your procedure no  4.   Have you had any family members/close contacts diagnosed with the COVID 19 since your procedure?  no   If yes to any of these questions please route to Joylene John, RN and Joella Prince, RN

## 2021-01-21 ENCOUNTER — Encounter: Payer: Self-pay | Admitting: Internal Medicine

## 2021-06-09 DIAGNOSIS — Z125 Encounter for screening for malignant neoplasm of prostate: Secondary | ICD-10-CM | POA: Diagnosis not present

## 2021-06-09 DIAGNOSIS — Z13220 Encounter for screening for lipoid disorders: Secondary | ICD-10-CM | POA: Diagnosis not present

## 2021-06-09 DIAGNOSIS — Z0000 Encounter for general adult medical examination without abnormal findings: Secondary | ICD-10-CM | POA: Diagnosis not present

## 2021-06-13 DIAGNOSIS — Z0000 Encounter for general adult medical examination without abnormal findings: Secondary | ICD-10-CM | POA: Diagnosis not present

## 2021-07-14 DIAGNOSIS — J019 Acute sinusitis, unspecified: Secondary | ICD-10-CM | POA: Diagnosis not present

## 2021-07-14 DIAGNOSIS — R059 Cough, unspecified: Secondary | ICD-10-CM | POA: Diagnosis not present

## 2021-11-14 DIAGNOSIS — D225 Melanocytic nevi of trunk: Secondary | ICD-10-CM | POA: Diagnosis not present

## 2021-11-14 DIAGNOSIS — L821 Other seborrheic keratosis: Secondary | ICD-10-CM | POA: Diagnosis not present

## 2021-11-14 DIAGNOSIS — L812 Freckles: Secondary | ICD-10-CM | POA: Diagnosis not present

## 2021-11-14 DIAGNOSIS — L308 Other specified dermatitis: Secondary | ICD-10-CM | POA: Diagnosis not present

## 2021-12-04 ENCOUNTER — Ambulatory Visit: Payer: BC Managed Care – PPO | Admitting: Internal Medicine

## 2021-12-04 ENCOUNTER — Encounter: Payer: Self-pay | Admitting: Internal Medicine

## 2021-12-04 VITALS — BP 122/70 | HR 55 | Ht 70.0 in | Wt 173.0 lb

## 2021-12-04 DIAGNOSIS — K200 Eosinophilic esophagitis: Secondary | ICD-10-CM

## 2021-12-04 DIAGNOSIS — R198 Other specified symptoms and signs involving the digestive system and abdomen: Secondary | ICD-10-CM | POA: Diagnosis not present

## 2021-12-04 DIAGNOSIS — R1319 Other dysphagia: Secondary | ICD-10-CM

## 2021-12-04 DIAGNOSIS — K6289 Other specified diseases of anus and rectum: Secondary | ICD-10-CM | POA: Diagnosis not present

## 2021-12-04 NOTE — Progress Notes (Deleted)
[facility] History and Physical ? ? ?Primary Care Physician:  [name], MD ? ? ?Reason for Procedure:   *** ? ?Plan:    *** ? ? ? ? ?HPI: Stephen Chambers is a 56 y.o. male  ? ? ?Past Medical History:  ?Diagnosis Date  ? Allergy   ? Eosinophilic esophagitis 02/03/6766  ? GERD (gastroesophageal reflux disease)   ? Hx of colonic polyps 09/14/2015  ? Internal hemorrhoids with fecal soiling and bleeding 08/09/2014  ? Schatzki's ring 2005  ? ? ?Past Surgical History:  ?Procedure Laterality Date  ? COLONOSCOPY    ? COLONOSCOPY W/ BIOPSIES    ? ESOPHAGOGASTRODUODENOSCOPY  01/12/04  ? HEMORRHOID BANDING    ? POLYPECTOMY    ? UPPER GASTROINTESTINAL ENDOSCOPY    ? VASECTOMY    ? WISDOM TOOTH EXTRACTION    ? ? ?Prior to Admission medications   ?Medication Sig Start Date End Date Taking? Authorizing Provider  ?albuterol (VENTOLIN HFA) 108 (90 Base) MCG/ACT inhaler Inhale 1 puff into the lungs as needed. 11/14/21  Yes [provider]  ?Fexofenadine HCl (ALLEGRA ALLERGY PO) Take 1 tablet by mouth daily.   Yes [provider]  ?fluocinonide cream (LIDEX) 2.09 % Apply 1 application. topically 2 (two) times daily. 11/14/21  Yes [provider]  ?pantoprazole (PROTONIX) 20 MG tablet TAKE 1 TABLET BY MOUTH DAILY, 30 MINUTES BEFORE BREAKFAST 11/29/20  Yes Gatha Mayer, MD  ? ? ?Current Outpatient Medications  ?Medication Sig Dispense Refill  ? albuterol (VENTOLIN HFA) 108 (90 Base) MCG/ACT inhaler Inhale 1 puff into the lungs as needed.    ? Fexofenadine HCl (ALLEGRA ALLERGY PO) Take 1 tablet by mouth daily.    ? fluocinonide cream (LIDEX) 4.70 % Apply 1 application. topically 2 (two) times daily.    ? pantoprazole (PROTONIX) 20 MG tablet TAKE 1 TABLET BY MOUTH DAILY, 30 MINUTES BEFORE BREAKFAST 90 tablet 3  ? ?No current facility-administered medications for this visit.  ? ? ?Allergies as of 12/04/2021 - Review Complete 12/04/2021  ?Allergen Reaction Noted  ? Other  07/14/2021  ? Peanut oil   07/14/2021  ? ? ?Family History  ?Problem Relation Age of Onset  ? Colon cancer Paternal Grandfather   ? Colon polyps Neg Hx   ? Esophageal cancer Neg Hx   ? Rectal cancer Neg Hx   ? Stomach cancer Neg Hx   ? ? ?Social History  ? ?Socioeconomic History  ? Marital status: Married  ?  Spouse name: Not on file  ? Number of children: 3  ? Years of education: Not on file  ? Highest education level: Not on file  ?Occupational History  ? Occupation: construction-commercial  ?  Employer: [employer]  ?  Comment: owner  ?Tobacco Use  ? Smoking status: Never  ? Smokeless tobacco: Never  ?Vaping Use  ? Vaping Use: Never used  ?Substance and Sexual Activity  ? Alcohol use: Yes  ?  Comment: social   ? Drug use: No  ? Sexual activity: Not on file  ?Other Topics Concern  ? Not on file  ?Social History Narrative  ? Married, 2 sons one daughter - all living at home  ? He owns a [employer].  ? 2-3 caffeinated beverages a day and 2 alcoholic beverages a day as of 12/30/2012.  ? ?Social Determinants of Health  ? ?Financial Resource Strain: Not on file  ?Food Insecurity: Not on file  ?Transportation Needs: Not on file  ?Physical  Activity: Not on file  ?Stress: Not on file  ?Social Connections: Not on file  ?Intimate Partner Violence: Not on file  ? ? ?Review of Systems: ?Positive for *** ?All other review of systems negative except as mentioned in the HPI. ? ?Physical Exam: ?Vital signs ?BP 122/70   Pulse (!) 55   Ht '5\' 10"'$  (1.778 m)   Wt 173 lb (78.5 kg)   BMI 24.82 kg/m?  ? ?General:   Alert,  Well-developed, well-nourished, pleasant and cooperative in NAD ?Lungs:  Clear throughout to auscultation.   ?Heart:  Regular rate and rhythm; no murmurs, clicks, rubs,  or gallops. ?Abdomen:  Soft, nontender and nondistended. Normal bowel sounds.   ?Neuro/Psych:  Alert and cooperative. Normal mood and affect. A and O x 3 ? ? ?'@Cande Mastropietro'$  Simonne Maffucci, MD, Mar[facility] Regal ?[facility] ?[phone] (pager) ?12/04/2021 11:11  AM@ ? ?

## 2021-12-04 NOTE — Patient Instructions (Signed)
It has been recommended to you by your physician that you have a(n) EGD completed. Per your request, we did not schedule the procedure(s) today. Please contact our office at [phone] should you decide to have the procedure completed. You will be scheduled for a pre-visit and procedure at that time. ? ?We  are going to place a referral to [facility] to get you an appointment. They will contact you. ? ? ?I appreciate the opportunity to care for you. ?Silvano Rusk, MD, [facility] ? ?

## 2021-12-04 NOTE — Progress Notes (Signed)
? ?Stephen Chambers 56 y.o. 01/14/1966 [id number] ? ?Assessment & Plan:  ? ?Encounter Diagnoses  ?Name Primary?  ? Rectal pain Yes  ? Defecation symptom   ? Eosinophilic esophagitis   ? Esophageal dysphagia   ? ? ?His rectal symptoms have not been helped by hemorrhoidal banding or fiber supplementation.  My original Link Snuffer that there was anal closure issues due to hemorrhoids appears to be wrong.  He has some pain post defecation and exercise, but is after exercise not immediately after defecation.  This does not seem like a fissure.  At times I thought that his anal tone was increased.  I do not see any evidence of a fistula.  He says he could live with the symptoms but is asking for additional help if possible so I will refer him to Dr. Nadeen Landau of [facility] for another opinion. ? ?Regarding eosinophilic esophagitis, dysphagia and PPI use schedule EGD for reassessment.  Would biopsy and plan for dilation possibly.  We talked about how Dupixent now has an indication for EOE but both he and I are not inclined to pursue that. ? ?The risks and benefits as well as alternatives of endoscopic procedure(s) have been provided for the patient to review.  He is familiar with the procedures from prior experience as well. ? ? ?CC: [name], MD ?Dr. Nadeen Landau ?Subjective:  ? ?Chief Complaint: Difficulty cleansing post defecation, rectal pain rare bleeding, history of hemorrhoid banding ? ?HPI ?Stephen Chambers is a 40 year old white man with a history of eosinophilic esophagitis as well as what I thought were symptomatic hemorrhoids with fecal soiling and bleeding, status post banding procedures without benefit.  Because of fecal smearing issues and rectal bleeding issues in the past I had banded hemorrhoids multiple times in 2016 and then again in 2017.  He has never really gotten improvement with difficulty in cleansing and wiping.  If he goes and runs or rides the Peloton soon after defecating he  will often have sharp pain in the anal area much more with running as opposed to sitting on the bicycle seat.  He sometimes has a slight amount of bright red blood per rectum.  Stools are formed, no straining to defecate.  Does not soil his underwear.  The pain can be sharp and fairly intense and bothersome though he says he could live with the symptoms he has he is just wondering if there is something else that could be done.  He has tried fiber supplementation in the past without help.  He does not use artificial sweeteners. ? ?Regarding history of eosinophilic esophagitis he raises questions about trying to come off PPI.  He is on 20 mg of pantoprazole.  If he tries to stop it he gets reflux/heartburn.  He never had heartburn issues.  Last EGD was in 2014 he had a Rembert dilation at that time.  He does still have some impact dysphagia intermittently though not severe spells like he had prior to diagnosis and treatment.  Prior to 2014 EGD and dilation by me he had EGD 2005 by Dr. Teena Irani and dilation of a "subtle ring". ?Allergies  ?Allergen Reactions  ? Other   ?  Other reaction(s): runny nose, itchy eyes  ? Peanut Oil   ?  Other reaction(s): (per scratch test)  ? ?Current Meds  ?Medication Sig  ? albuterol (VENTOLIN HFA) 108 (90 Base) MCG/ACT inhaler Inhale 1 puff into the lungs as needed.  ? Fexofenadine HCl (ALLEGRA ALLERGY  PO) Take 1 tablet by mouth daily.  ? fluocinonide cream (LIDEX) 1.63 % Apply 1 application. topically 2 (two) times daily.  ? pantoprazole (PROTONIX) 20 MG tablet TAKE 1 TABLET BY MOUTH DAILY, 30 MINUTES BEFORE BREAKFAST  ? ?Past Medical History:  ?Diagnosis Date  ? Allergy   ? Eosinophilic esophagitis 03/02/6658  ? GERD (gastroesophageal reflux disease)   ? Hx of colonic polyps 09/14/2015  ? Internal hemorrhoids with fecal soiling and bleeding 08/09/2014  ? Schatzki's ring 2005  ? ?Past Surgical History:  ?Procedure Laterality Date  ? COLONOSCOPY    ? COLONOSCOPY W/ BIOPSIES    ?  ESOPHAGOGASTRODUODENOSCOPY  01/12/04  ? HEMORRHOID BANDING    ? POLYPECTOMY    ? UPPER GASTROINTESTINAL ENDOSCOPY    ? VASECTOMY    ? WISDOM TOOTH EXTRACTION    ? ?Social History  ? ?Social History Narrative  ? Married, 2 sons one daughter - all living at home  ? He owns a [employer].  ? 2-3 caffeinated beverages a day and 2 alcoholic beverages a day as of 12/30/2012.  ? ?family history includes Colon cancer in his paternal grandfather. ? ? ?Review of Systems ?As per HPI ? ?Objective:  ? Physical Exam ?BP 122/70   Pulse (!) 55   Ht '5\' 10"'$  (1.778 m)   Wt 173 lb (78.5 kg)   BMI 24.82 kg/m?  ? ?Rectal ? ? ?Anoderm inspection revealed normal anoderm without rash ? ?Digital exam revealed normal versus somewhat increased resting tone and normal voluntary squeeze. ?No mass or rectocele present. ?Simulated defecation with valsalva revealed appropriate abdominal contraction and descent. ?  ? ?Anoscopy Gr 1 int/ext hemorrhoids without complication noted ? ?

## 2021-12-13 ENCOUNTER — Encounter: Payer: Self-pay | Admitting: Family Medicine

## 2021-12-13 ENCOUNTER — Ambulatory Visit: Payer: Self-pay

## 2021-12-13 ENCOUNTER — Ambulatory Visit: Payer: BC Managed Care – PPO | Admitting: Family Medicine

## 2021-12-13 VITALS — BP 120/78 | Ht 70.0 in | Wt 170.0 lb

## 2021-12-13 DIAGNOSIS — G8929 Other chronic pain: Secondary | ICD-10-CM

## 2021-12-13 DIAGNOSIS — M25511 Pain in right shoulder: Secondary | ICD-10-CM | POA: Diagnosis not present

## 2021-12-13 NOTE — Progress Notes (Signed)
Patient was instructed in 10 minutes of therapeutic exercises for right shoulder pain to improve strength, ROM and function according to my instructions and plan of care by a Certified Athletic Trainer during the office visit.  Proper technique shown and discussed, handout provided.  All questions discussed and answered.  ?

## 2021-12-13 NOTE — Progress Notes (Signed)
PCP: [name], MD ? ?Subjective:  ? ?HPI: ?Patient is a 56 y.o. male here for right shoulder pain. ? ?Patient reports pain in anterior right shoulder. Symptoms began 6 months ago and have been relatively unchanged over that time period. No preceding injury or trauma that he's aware of. He does play tennis occasionally and this aggravates the pain. After playing tennis he'll have pain at rest and during sleep. Otherwise he only has pain with certain movements, including throwing a ball, reaching behind his back, or doing pushups. He has taken Ibuprofen on a few occasions with slight temporary improvement. No numbness, tingling, or weakness throughout his right arm. ?He is right hand dominant. No prior history of shoulder pain or injuries. ? ? ?Past Medical History:  ?Diagnosis Date  ? Allergy   ? Eosinophilic esophagitis 02/01/1606  ? GERD (gastroesophageal reflux disease)   ? Hx of colonic polyps 09/14/2015  ? Internal hemorrhoids with fecal soiling and bleeding 08/09/2014  ? Schatzki's ring 2005  ? ? ?Current Outpatient Medications on File Prior to Visit  ?Medication Sig Dispense Refill  ? albuterol (VENTOLIN HFA) 108 (90 Base) MCG/ACT inhaler Inhale 1 puff into the lungs as needed.    ? Fexofenadine HCl (ALLEGRA ALLERGY PO) Take 1 tablet by mouth daily.    ? fluocinonide cream (LIDEX) 3.71 % Apply 1 application. topically 2 (two) times daily.    ? pantoprazole (PROTONIX) 20 MG tablet TAKE 1 TABLET BY MOUTH DAILY, 30 MINUTES BEFORE BREAKFAST 90 tablet 3  ? ?No current facility-administered medications on file prior to visit.  ? ? ?Past Surgical History:  ?Procedure Laterality Date  ? COLONOSCOPY    ? COLONOSCOPY W/ BIOPSIES    ? ESOPHAGOGASTRODUODENOSCOPY  01/12/04  ? HEMORRHOID BANDING    ? POLYPECTOMY    ? UPPER GASTROINTESTINAL ENDOSCOPY    ? VASECTOMY    ? WISDOM TOOTH EXTRACTION    ? ? ?Allergies  ?Allergen Reactions  ? Other   ?  Other reaction(s): runny nose, itchy eyes  ? Peanut Oil   ?  Other reaction(s):  (per scratch test)  ? ? ?BP 120/78   Ht '5\' 10"'$  (1.778 m)   Wt 170 lb (77.1 kg)   BMI 24.39 kg/m?  ? ?    ?Objective:  ?Physical Exam: ? ?Gen: NAD, comfortable in exam room ?Shoulder: ?Inspection reveals no obvious deformity, atrophy, or asymmetry. No bruising. No swelling ?No TTP over bicipital groove. Minimal discomfort with TTP of AC joint. ?Full ROM in flexion, abduction, internal/external rotation ?NV intact distally ?Special Tests:  ?- Impingement: Neg Hawkins, neers, empty can sign. ?- Supraspinatous: Negative empty can.  5/5 strength with resisted flexion at 20 degrees ?- Infraspinatous/Teres Minor: 5/5 strength with ER ?- Subscapularis: negative belly press, negative bear hug. 5/5 strength with IR ?- Biceps tendon: Negative Speeds ?- AC Joint: Negative cross arm ?- No painful arc and no drop arm sign ?- Positive O'Brien's  ? ?Complete MSK u/s right shoulder: ?Biceps tendon: intact without tenosynovitis ?Pec major tendon: intact without tear ?Subscapularis: small area of interstitial hypoechoic change consistent with tendinopathy vs very small tear. No dynamic impingement ?AC joint: mild arthropathy with mild effusion ?Infraspinatus: intact without tears. ?Supraspinatus: moderate subacromial bursitis with thickened bursal sac.  Supraspinatus appears intact though unable to completely eliminate suspected anisotrophy in long axis. ?Posterior glenohumeral joint: no effusion, paralabral cyst.  No visible posterior labral tear. ? ?Impression: right shoulder moderate subacromial bursitis.  Mild AC arthropathy with mild effusion but  no pain on sonopalpation.  Tendinopathy of subscapularis. ? ?Assessment & Plan:  ?1. Right Shoulder Pain- presentation suspicious for labral injury given history of difficulty throwing a ball and relatively normal exam with excellent ROM and strength on rotator cuff testing. He does have positive O'Brien's test on exam which further supports labral pathology. Ultrasound performed  today also shows component of bursitis and mild AC joint arthropathy. Recommend conservative treatment with home shoulder exercises, Aleve x7 days, and avoidance of painful activities. Follow up in 6 weeks. ? ? ?Alcus Dad, MD ?PGY-2 [facility] ? ?

## 2021-12-13 NOTE — Patient Instructions (Signed)
Your pain is primarily due to irritation of the labrum (possible small tear). ?These typically heal with conservative treatment. ?Your ultrasound showed some bursitis and underlying arthritis but these aren't the major reasons you're hurting. ?Take aleve 2 tabs twice a day with food for 7 days then as needed. ?Try to avoid painful activities (overhead activities, lifting with extended arm) as much as possible. ?Consider physical therapy with transition to home exercise program. ?Do home exercise program with theraband and scapular stabilization exercises daily 3 sets of 10 once a day. ?If not improving at follow-up we will consider further imaging, injection, physical therapy. ?Follow up with me in 5-6 weeks. ? ?

## 2022-01-17 ENCOUNTER — Encounter: Payer: Self-pay | Admitting: Family Medicine

## 2022-01-17 ENCOUNTER — Ambulatory Visit: Payer: BC Managed Care – PPO | Admitting: Family Medicine

## 2022-01-17 VITALS — BP 122/85 | Ht 70.0 in | Wt 170.0 lb

## 2022-01-17 DIAGNOSIS — M25511 Pain in right shoulder: Secondary | ICD-10-CM | POA: Diagnosis not present

## 2022-01-17 DIAGNOSIS — G8929 Other chronic pain: Secondary | ICD-10-CM | POA: Diagnosis not present

## 2022-01-17 NOTE — Patient Instructions (Signed)
Continue your home exercises, aleve. Consider MRI arthrogram, physical therapy if you don't continue to improve - just call me if this is the case and we can set either one of these up for you. Follow up with me as needed if you continue to slowly improve. In meantime I would avoid throwing, overhead serves for at least another 4-6 weeks.

## 2022-01-17 NOTE — Progress Notes (Signed)
PCP: [name], MD  Subjective:   HPI: Patient is a 56 y.o. male here for right shoulder pain.  5/17: Patient reports pain in anterior right shoulder. Symptoms began 6 months ago and have been relatively unchanged over that time period. No preceding injury or trauma that he's aware of. He does play tennis occasionally and this aggravates the pain. After playing tennis he'll have pain at rest and during sleep. Otherwise he only has pain with certain movements, including throwing a ball, reaching behind his back, or doing pushups. He has taken Ibuprofen on a few occasions with slight temporary improvement. No numbness, tingling, or weakness throughout his right arm. He is right hand dominant. No prior history of shoulder pain or injuries.  6/21: Patient reports he's about 25% improved compared to last visit. Doing home exercises, taking aleve. Throwing and reaching still a problem. Was able to play tennis the other day. No new injuries.  Past Medical History:  Diagnosis Date   Allergy    Eosinophilic esophagitis 01/27/625   GERD (gastroesophageal reflux disease)    Hx of colonic polyps 09/14/2015   Internal hemorrhoids with fecal soiling and bleeding 08/09/2014   Schatzki's ring 2005    Current Outpatient Medications on File Prior to Visit  Medication Sig Dispense Refill   albuterol (VENTOLIN HFA) 108 (90 Base) MCG/ACT inhaler Inhale 1 puff into the lungs as needed.     Fexofenadine HCl (ALLEGRA ALLERGY PO) Take 1 tablet by mouth daily.     fluocinonide cream (LIDEX) 9.48 % Apply 1 application. topically 2 (two) times daily.     pantoprazole (PROTONIX) 20 MG tablet TAKE 1 TABLET BY MOUTH DAILY, 30 MINUTES BEFORE BREAKFAST 90 tablet 3   No current facility-administered medications on file prior to visit.    Past Surgical History:  Procedure Laterality Date   COLONOSCOPY     COLONOSCOPY W/ BIOPSIES     ESOPHAGOGASTRODUODENOSCOPY  01/12/04   HEMORRHOID BANDING     POLYPECTOMY      UPPER GASTROINTESTINAL ENDOSCOPY     VASECTOMY     WISDOM TOOTH EXTRACTION      Allergies  Allergen Reactions   Other     Other reaction(s): runny nose, itchy eyes   Peanut Oil     Other reaction(s): (per scratch test)    BP 122/85   Ht '5\' 10"'$  (1.778 m)   Wt 170 lb (77.1 kg)   BMI 24.39 kg/m      01/17/2022    9:52 AM  [place] [facility]  Frequency of aerobic exercise (# of days/week) 3  Average time in minutes 20  Frequency of strengthening activities (# of days/week) 1        No data to display              Objective:  Physical Exam:  Gen: NAD, comfortable in exam room  Right shoulder: No swelling, ecchymoses.  No gross deformity. No TTP. FROM. Negative Hawkins, Neers. Negative Yergasons. Strength 5/5 with empty can and resisted internal/external rotation.  Minimal discomfort empty can and external rotation. Negative apprehension, sulcus, o'briens. NV intact distally.   Assessment & Plan:  1. Right shoulder pain - mild improvement compared to last visit.  Exam improved.  History, exam last visit consistent with labral pathology.  Aleve, home exercises.  He would like to continue these measures.  If not improving will consider MR arthrogram, formal physical therapy.

## 2022-02-09 ENCOUNTER — Other Ambulatory Visit: Payer: Self-pay | Admitting: Internal Medicine

## 2022-02-09 DIAGNOSIS — K200 Eosinophilic esophagitis: Secondary | ICD-10-CM

## 2022-02-22 ENCOUNTER — Telehealth: Payer: Self-pay | Admitting: Internal Medicine

## 2022-02-22 ENCOUNTER — Other Ambulatory Visit (HOSPITAL_COMMUNITY): Payer: Self-pay

## 2022-02-22 ENCOUNTER — Telehealth: Payer: Self-pay

## 2022-02-22 NOTE — Telephone Encounter (Signed)
Thank you so much for your help with the PA.

## 2022-02-22 NOTE — Telephone Encounter (Signed)
Contacted patient and confirmed Esomeprazole, Omeprazole, Lansoprazole have all been tried and proven ineffective. Prior authorization has been approved and added to patient chart.

## 2022-02-22 NOTE — Telephone Encounter (Signed)
Thank you so much for your help Colletta [place]. I don't know the answer to your question unfortunately .

## 2022-02-22 NOTE — Telephone Encounter (Signed)
Patient called regarding pantoprazole.  Pharmacy received prescription; however, it requires a prior authorization.  Patient assumed this had been taken care of.  However, it does not appear to be.  Patient is going out of the [place] and will need to pick up this medication no later than noon tomorrow.  Is there any way this PA can be expedited?  Please call patient and advise.  Thank you.

## 2022-02-22 NOTE — Telephone Encounter (Signed)
I spoke to Stephen Chambers and told him the prior authorization team is working on this.

## 2022-02-22 NOTE — Telephone Encounter (Signed)
Patient Advocate Encounter  Prior Authorization for Pantoprazole '20MG'$  has been submitted and approved.   Effective: 02/22/2022 to 02/21/2023.  Clista Bernhardt, CPhT Rx Patient Advocate Specialist Phone: [phone]

## 2022-03-07 DIAGNOSIS — R351 Nocturia: Secondary | ICD-10-CM | POA: Diagnosis not present

## 2022-03-07 DIAGNOSIS — N5201 Erectile dysfunction due to arterial insufficiency: Secondary | ICD-10-CM | POA: Diagnosis not present

## 2022-03-07 DIAGNOSIS — R350 Frequency of micturition: Secondary | ICD-10-CM | POA: Diagnosis not present

## 2022-04-11 DIAGNOSIS — N5201 Erectile dysfunction due to arterial insufficiency: Secondary | ICD-10-CM | POA: Diagnosis not present

## 2022-04-11 DIAGNOSIS — R350 Frequency of micturition: Secondary | ICD-10-CM | POA: Diagnosis not present

## 2022-05-21 ENCOUNTER — Other Ambulatory Visit: Payer: Self-pay | Admitting: Internal Medicine

## 2022-05-21 DIAGNOSIS — K200 Eosinophilic esophagitis: Secondary | ICD-10-CM

## 2022-05-23 DIAGNOSIS — R351 Nocturia: Secondary | ICD-10-CM | POA: Diagnosis not present

## 2022-05-23 DIAGNOSIS — R350 Frequency of micturition: Secondary | ICD-10-CM | POA: Diagnosis not present

## 2022-07-10 DIAGNOSIS — Z13220 Encounter for screening for lipoid disorders: Secondary | ICD-10-CM | POA: Diagnosis not present

## 2022-07-10 DIAGNOSIS — Z125 Encounter for screening for malignant neoplasm of prostate: Secondary | ICD-10-CM | POA: Diagnosis not present

## 2022-07-10 DIAGNOSIS — Z0000 Encounter for general adult medical examination without abnormal findings: Secondary | ICD-10-CM | POA: Diagnosis not present

## 2022-07-12 DIAGNOSIS — Z0000 Encounter for general adult medical examination without abnormal findings: Secondary | ICD-10-CM | POA: Diagnosis not present

## 2022-08-10 ENCOUNTER — Ambulatory Visit (AMBULATORY_SURGERY_CENTER): Payer: BC Managed Care – PPO

## 2022-08-10 VITALS — Ht 70.0 in | Wt 170.0 lb

## 2022-08-10 DIAGNOSIS — R1319 Other dysphagia: Secondary | ICD-10-CM

## 2022-08-10 DIAGNOSIS — K200 Eosinophilic esophagitis: Secondary | ICD-10-CM

## 2022-08-10 NOTE — Progress Notes (Signed)

## 2022-08-27 ENCOUNTER — Telehealth: Payer: Self-pay | Admitting: Internal Medicine

## 2022-08-27 NOTE — Telephone Encounter (Signed)
Patient calling in regards to prep instructions. Please advise.

## 2022-08-27 NOTE — Telephone Encounter (Signed)
Pt called stating that he had taken ibuprofen last week and saw on his instruction sheet that he needed to stop for 7 days prior. Instructed him that If he wasn't taking it daily and just once that he should be ok for his EGD tomorrow.

## 2022-08-28 ENCOUNTER — Encounter: Payer: Self-pay | Admitting: Internal Medicine

## 2022-08-28 ENCOUNTER — Ambulatory Visit (AMBULATORY_SURGERY_CENTER): Payer: BC Managed Care – PPO | Admitting: Internal Medicine

## 2022-08-28 VITALS — BP 116/80 | HR 64 | Temp 98.7°F | Resp 11 | Ht 70.0 in | Wt 170.0 lb

## 2022-08-28 DIAGNOSIS — K200 Eosinophilic esophagitis: Secondary | ICD-10-CM

## 2022-08-28 DIAGNOSIS — R1319 Other dysphagia: Secondary | ICD-10-CM

## 2022-08-28 DIAGNOSIS — K2100 Gastro-esophageal reflux disease with esophagitis, without bleeding: Secondary | ICD-10-CM | POA: Diagnosis not present

## 2022-08-28 MED ORDER — SODIUM CHLORIDE 0.9 % IV SOLN: 500.0000 mL | Freq: Once | INTRAVENOUS | Status: DC

## 2022-08-28 NOTE — Progress Notes (Signed)
Called to room to assist during endoscopic procedure.  Patient ID and intended procedure confirmed with present staff. Received instructions for my participation in the procedure from the performing physician.  

## 2022-08-28 NOTE — Progress Notes (Signed)
Report to PACU, RN, vss, BBS= Clear.  

## 2022-08-28 NOTE — Progress Notes (Signed)
[facility] History and Physical   Primary Care Physician:  [name], MD   Reason for Procedure:   Dysphagia and eosinophilic esophagitius  Plan:    EGD, possible esophageal dilation     HPI: Stephen Chambers is a 57 y.o. male w/ EOE and dysphagia issues, seen in 5/23 - here for reassessment. Has some desire to try to stop PPI.   Past Medical History:  Diagnosis Date   Allergy    Eosinophilic esophagitis 04/07/8337   GERD (gastroesophageal reflux disease)    Hx of colonic polyps 09/14/2015   Internal hemorrhoids with fecal soiling and bleeding 08/09/2014   Schatzki's ring 2005    Past Surgical History:  Procedure Laterality Date   COLONOSCOPY     COLONOSCOPY W/ BIOPSIES     ESOPHAGOGASTRODUODENOSCOPY  01/12/04   HEMORRHOID BANDING     POLYPECTOMY     UPPER GASTROINTESTINAL ENDOSCOPY     VASECTOMY     WISDOM TOOTH EXTRACTION      Prior to Admission medications   Medication Sig Start Date End Date Taking? Authorizing Provider  Fexofenadine HCl (ALLEGRA ALLERGY PO) Take 1 tablet by mouth daily.   Yes [provider]  pantoprazole (PROTONIX) 20 MG tablet TAKE 1 TABLET BY MOUTH DAILY 30 MINUTES BEFORE BREAKFAST 05/21/22  Yes Gatha Mayer, MD  albuterol (VENTOLIN HFA) 108 (90 Base) MCG/ACT inhaler Inhale 1 puff into the lungs as needed. 11/14/21   [provider]    Current Outpatient Medications  Medication Sig Dispense Refill   Fexofenadine HCl (ALLEGRA ALLERGY PO) Take 1 tablet by mouth daily.     pantoprazole (PROTONIX) 20 MG tablet TAKE 1 TABLET BY MOUTH DAILY 30 MINUTES BEFORE BREAKFAST 90 tablet 1   albuterol (VENTOLIN HFA) 108 (90 Base) MCG/ACT inhaler Inhale 1 puff into the lungs as needed.     Current Facility-Administered Medications  Medication Dose Route Frequency Provider Last Rate Last Admin   0.9 %  sodium chloride infusion  500 mL Intravenous Once Gatha Mayer, MD        Allergies as of 08/28/2022 - Review Complete  08/28/2022  Allergen Reaction Noted   Other  07/14/2021   Peanut oil  07/14/2021    Family History  Problem Relation Age of Onset   Colon cancer Paternal Grandfather    Colon polyps Neg Hx    Esophageal cancer Neg Hx    Rectal cancer Neg Hx    Stomach cancer Neg Hx     Social History   Socioeconomic History   Marital status: Married    Spouse name: Not on file   Number of children: 3   Years of education: Not on file   Highest education level: Not on file  Occupational History   Occupation: construction-commercial    Employer: [employer]    Comment: owner  Tobacco Use   Smoking status: Never   Smokeless tobacco: Never  Vaping Use   Vaping Use: Never used  Substance and Sexual Activity   Alcohol use: Yes    Comment: social    Drug use: No   Sexual activity: Not on file  Other Topics Concern   Not on file  Social History Narrative   Married, 2 sons one daughter - all living at home   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   2-3 caffeinated beverages a day and 2 alcoholic beverages a day as of 12/30/2012.   Social Determinants of Health   Financial Resource Strain: Not on  file  Food Insecurity: Not on file  Transportation Needs: Not on file  Physical Activity: Not on file  Stress: Not on file  Social Connections: Not on file  Intimate Partner Violence: Not on file    Review of Systems:  All other review of systems negative except as mentioned in the HPI.  Physical Exam: Vital signs BP 118/64   Pulse 61   Temp 98.7 F (37.1 C) (Temporal)   Ht '5\' 10"'$  (1.778 m)   Wt 170 lb (77.1 kg)   SpO2 97%   BMI 24.39 kg/m   General:   Alert,  Well-developed, well-nourished, pleasant and cooperative in NAD Lungs:  Clear throughout to auscultation.   Heart:  Regular rate and rhythm; no murmurs, clicks, rubs,  or gallops. Abdomen:  Soft, nontender and nondistended. Normal bowel sounds.   Neuro/Psych:  Alert and cooperative. Normal mood and affect. A and  O x 3   '@Clinton Wahlberg'$  Simonne Maffucci, MD, [facility] [phone] (pager) 08/28/2022 1:29 PM@

## 2022-08-28 NOTE — Progress Notes (Signed)
Pt's states no medical or surgical changes since previsit or office visit. 

## 2022-08-28 NOTE — Op Note (Signed)
[place] Patient Name: Stephen Chambers Procedure Date: 08/28/2022 12:36 PM MRN: [id number] Endoscopist: Gatha Mayer , MD, [id number] Age: 57 Referring MD:  Date of Birth: 1965-10-10 Gender: Male Account #: [id number] Procedure:                Upper GI endoscopy Indications:              Dysphagia, Eosinophilic esophagitis, Follow-up of                            eosinophilic esophagitis, For therapy of                            eosinophilic esophagitis Medicines:                Monitored Anesthesia Care Procedure:                Pre-Anesthesia Assessment:                           - Prior to the procedure, a History and Physical                            was performed, and patient medications and                            allergies were reviewed. The patient's tolerance of                            previous anesthesia was also reviewed. The risks                            and benefits of the procedure and the sedation                            options and risks were discussed with the patient.                            All questions were answered, and informed consent                            was obtained. Prior Anticoagulants: The patient has                            taken no anticoagulant or antiplatelet agents. ASA                            Grade Assessment: II - A patient with mild systemic                            disease. After reviewing the risks and benefits,                            the patient was deemed in satisfactory condition to  undergo the procedure.                           After obtaining informed consent, the endoscope was                            passed under direct vision. Throughout the                            procedure, the patient's blood pressure, pulse, and                            oxygen saturations were monitored continuously. The                            Endoscope was introduced through the  mouth, and                            advanced to the second part of duodenum. The upper                            GI endoscopy was accomplished without difficulty.                            The patient tolerated the procedure well. Scope In: Scope Out: Findings:                 Mucosal changes including feline appearance,                            longitudinal furrows and punctate white spots were                            found in the entire esophagus. Biopsies were                            obtained from the proximal and distal esophagus                            with cold forceps for histology of suspected                            eosinophilic esophagitis. Verification of patient                            identification for the specimen was done. Estimated                            blood loss was minimal. The scope was withdrawn.                            Dilation was performed with a Maloney dilator with                            no resistance  at 110 Fr. The dilation site was                            examined following endoscope reinsertion and showed                            no change. Estimated blood loss: none.                           The exam was otherwise without abnormality.                           The cardia and gastric fundus were normal on                            retroflexion. Complications:            No immediate complications. Estimated Blood Loss:     Estimated blood loss was minimal. Impression:               - Esophageal mucosal changes secondary to                            eosinophilic esophagitis. Dilated.                           - The examination was otherwise normal.                           - Biopsies were taken with a cold forceps for                            evaluation of eosinophilic esophagitis. Recommendation:           - Patient has a contact number available for                            emergencies. The signs and symptoms of  potential                            delayed complications were discussed with the                            patient. Return to normal activities tomorrow.                            Written discharge instructions were provided to the                            patient.                           - Clear liquids x 1 hour then soft foods rest of                            day. Start prior diet tomorrow.                           -  Continue present medications.                           - Await pathology results. Gatha Mayer, MD 08/28/2022 1:53:46 PM This report has been signed electronically.

## 2022-08-28 NOTE — Patient Instructions (Addendum)
It looks like you still have some changes of eosinophilic esophagitis. I biopsied again and dilated the esophagus also.  I will contact you with pathology results ands recommendations.  I appreciate the opportunity to care for you. Gatha Mayer, MD, [facility]  Follow esophageal dilation diet.  (Instructions given)  YOU HAD AN ENDOSCOPIC PROCEDURE TODAY AT [facility]:   Refer to the procedure report that was given to you for any specific questions about what was found during the examination.  If the procedure report does not answer your questions, please call your gastroenterologist to clarify.  If you requested that your care partner not be given the details of your procedure findings, then the procedure report has been included in a sealed envelope for you to review at your convenience later.  YOU SHOULD EXPECT: Some feelings of bloating in the abdomen. Passage of more gas than usual.  Walking can help get rid of the air that was put into your GI tract during the procedure and reduce the bloating. If you had a lower endoscopy (such as a colonoscopy or flexible sigmoidoscopy) you may notice spotting of blood in your stool or on the toilet paper. If you underwent a bowel prep for your procedure, you may not have a normal bowel movement for a few days.  Please Note:  You might notice some irritation and congestion in your nose or some drainage.  This is from the oxygen used during your procedure.  There is no need for concern and it should clear up in a day or so.  SYMPTOMS TO REPORT IMMEDIATELY:   Following upper endoscopy (EGD)  Vomiting of blood or coffee ground material  New chest pain or pain under the shoulder blades  Painful or persistently difficult swallowing  New shortness of breath  Fever of 100F or higher  Black, tarry-looking stools  For urgent or emergent issues, a gastroenterologist can be reached at any hour by calling [phone]. Do not use MyChart  messaging for urgent concerns.    DIET:  We do recommend a small meal at first, but then you may proceed to your regular diet.  Drink plenty of fluids but you should avoid alcoholic beverages for 24 hours.  ACTIVITY:  You should plan to take it easy for the rest of today and you should NOT DRIVE or use heavy machinery until tomorrow (because of the sedation medicines used during the test).    FOLLOW UP: Our staff will call the number listed on your records the next business day following your procedure.  We will call around 7:15- 8:00 am to check on you and address any questions or concerns that you may have regarding the information given to you following your procedure. If we do not reach you, we will leave a message.     If any biopsies were taken you will be contacted by phone or by letter within the next 1-3 weeks.  Please call us at [phone] if you have not heard about the biopsies in 3 weeks.    SIGNATURES/CONFIDENTIALITY: You and/or your care partner have signed paperwork which will be entered into your electronic medical record.  These signatures attest to the fact that that the information above on your After Visit Summary has been reviewed and is understood.  Full responsibility of the confidentiality of this discharge information lies with you and/or your care-partner.

## 2022-08-29 ENCOUNTER — Telehealth: Payer: Self-pay | Admitting: *Deleted

## 2022-08-29 NOTE — Telephone Encounter (Signed)
Post procedure follow up phone call. No answer at number given.  Left message on voicemail.  

## 2022-09-05 ENCOUNTER — Telehealth: Payer: Self-pay | Admitting: Internal Medicine

## 2022-09-05 MED ORDER — PANTOPRAZOLE SODIUM 40 MG PO TBEC: 40.0000 mg | DELAYED_RELEASE_TABLET | Freq: Every day | ORAL | 3 refills | Status: DC

## 2022-09-05 NOTE — Telephone Encounter (Signed)
Reviewed results with patient.  Current biopsies are more suggestive of GERD as opposed to EOE.  Dysphagia is improved after dilation though he may still have some slight symptoms.  Plan is to increase pantoprazole to 40 mg daily from 20 mg daily.  If this fails to satisfactorily relieve his symptoms over time he is to contact me for follow-up.

## 2022-11-19 DIAGNOSIS — L821 Other seborrheic keratosis: Secondary | ICD-10-CM | POA: Diagnosis not present

## 2022-11-19 DIAGNOSIS — L812 Freckles: Secondary | ICD-10-CM | POA: Diagnosis not present

## 2022-11-19 DIAGNOSIS — L723 Sebaceous cyst: Secondary | ICD-10-CM | POA: Diagnosis not present

## 2022-11-19 DIAGNOSIS — L853 Xerosis cutis: Secondary | ICD-10-CM | POA: Diagnosis not present

## 2022-11-23 ENCOUNTER — Other Ambulatory Visit: Payer: Self-pay | Admitting: Internal Medicine

## 2022-11-23 DIAGNOSIS — K200 Eosinophilic esophagitis: Secondary | ICD-10-CM

## 2023-05-14 DIAGNOSIS — J329 Chronic sinusitis, unspecified: Secondary | ICD-10-CM | POA: Diagnosis not present

## 2023-05-14 DIAGNOSIS — J3489 Other specified disorders of nose and nasal sinuses: Secondary | ICD-10-CM | POA: Diagnosis not present

## 2023-06-04 DIAGNOSIS — N138 Other obstructive and reflux uropathy: Secondary | ICD-10-CM | POA: Diagnosis not present

## 2023-06-04 DIAGNOSIS — R3589 Other polyuria: Secondary | ICD-10-CM | POA: Diagnosis not present

## 2023-06-04 DIAGNOSIS — R339 Retention of urine, unspecified: Secondary | ICD-10-CM | POA: Diagnosis not present

## 2023-06-04 DIAGNOSIS — E291 Testicular hypofunction: Secondary | ICD-10-CM | POA: Diagnosis not present

## 2023-06-04 DIAGNOSIS — N401 Enlarged prostate with lower urinary tract symptoms: Secondary | ICD-10-CM | POA: Diagnosis not present

## 2023-06-25 DIAGNOSIS — N138 Other obstructive and reflux uropathy: Secondary | ICD-10-CM | POA: Diagnosis not present

## 2023-06-25 DIAGNOSIS — N401 Enlarged prostate with lower urinary tract symptoms: Secondary | ICD-10-CM | POA: Diagnosis not present

## 2023-07-10 DIAGNOSIS — Z0000 Encounter for general adult medical examination without abnormal findings: Secondary | ICD-10-CM | POA: Diagnosis not present

## 2023-07-10 DIAGNOSIS — Z125 Encounter for screening for malignant neoplasm of prostate: Secondary | ICD-10-CM | POA: Diagnosis not present

## 2023-07-15 DIAGNOSIS — Z0000 Encounter for general adult medical examination without abnormal findings: Secondary | ICD-10-CM | POA: Diagnosis not present

## 2023-07-16 DIAGNOSIS — N138 Other obstructive and reflux uropathy: Secondary | ICD-10-CM | POA: Diagnosis not present

## 2023-07-16 DIAGNOSIS — N401 Enlarged prostate with lower urinary tract symptoms: Secondary | ICD-10-CM | POA: Diagnosis not present

## 2023-11-04 ENCOUNTER — Other Ambulatory Visit: Payer: Self-pay | Admitting: Internal Medicine

## 2023-12-03 DIAGNOSIS — D225 Melanocytic nevi of trunk: Secondary | ICD-10-CM | POA: Diagnosis not present

## 2023-12-03 DIAGNOSIS — L821 Other seborrheic keratosis: Secondary | ICD-10-CM | POA: Diagnosis not present

## 2023-12-03 DIAGNOSIS — L853 Xerosis cutis: Secondary | ICD-10-CM | POA: Diagnosis not present

## 2023-12-03 DIAGNOSIS — H43812 Vitreous degeneration, left eye: Secondary | ICD-10-CM | POA: Diagnosis not present

## 2023-12-03 DIAGNOSIS — L308 Other specified dermatitis: Secondary | ICD-10-CM | POA: Diagnosis not present

## 2024-07-10 DIAGNOSIS — Z0000 Encounter for general adult medical examination without abnormal findings: Secondary | ICD-10-CM | POA: Diagnosis not present

## 2024-07-10 DIAGNOSIS — Z125 Encounter for screening for malignant neoplasm of prostate: Secondary | ICD-10-CM | POA: Diagnosis not present

## 2024-07-24 ENCOUNTER — Encounter (INDEPENDENT_AMBULATORY_CARE_PROVIDER_SITE_OTHER): Payer: Self-pay

## 2024-09-10 ENCOUNTER — Institutional Professional Consult (permissible substitution) (INDEPENDENT_AMBULATORY_CARE_PROVIDER_SITE_OTHER)
